# Patient Record
Sex: Male | Born: 1965 | Race: Black or African American | Hispanic: No | Marital: Married | Smoking: Never smoker
Health system: Southern US, Community
[De-identification: ages and names within clinical notes are randomized; demographics above are authoritative.]

## PROBLEM LIST (undated history)

## (undated) DIAGNOSIS — I4819 Other persistent atrial fibrillation: Secondary | ICD-10-CM

## (undated) DIAGNOSIS — K5790 Diverticulosis of intestine, part unspecified, without perforation or abscess without bleeding: Secondary | ICD-10-CM

## (undated) DIAGNOSIS — I519 Heart disease, unspecified: Secondary | ICD-10-CM

## (undated) DIAGNOSIS — N189 Chronic kidney disease, unspecified: Secondary | ICD-10-CM

## (undated) DIAGNOSIS — I10 Essential (primary) hypertension: Secondary | ICD-10-CM

## (undated) DIAGNOSIS — I4892 Unspecified atrial flutter: Secondary | ICD-10-CM

## (undated) DIAGNOSIS — I420 Dilated cardiomyopathy: Secondary | ICD-10-CM

## (undated) DIAGNOSIS — N200 Calculus of kidney: Secondary | ICD-10-CM

## (undated) DIAGNOSIS — D126 Benign neoplasm of colon, unspecified: Secondary | ICD-10-CM

## (undated) DIAGNOSIS — E785 Hyperlipidemia, unspecified: Secondary | ICD-10-CM

## (undated) HISTORY — DX: Diverticulosis of intestine, part unspecified, without perforation or abscess without bleeding: K57.90

## (undated) HISTORY — DX: Heart disease, unspecified: I51.9

## (undated) HISTORY — DX: Hyperlipidemia, unspecified: E78.5

## (undated) HISTORY — PX: WISDOM TOOTH EXTRACTION: SHX21

## (undated) HISTORY — DX: Calculus of kidney: N20.0

## (undated) HISTORY — DX: Unspecified atrial flutter: I48.92

## (undated) HISTORY — DX: Benign neoplasm of colon, unspecified: D12.6

## (undated) HISTORY — DX: Essential (primary) hypertension: I10

## (undated) HISTORY — DX: Chronic kidney disease, unspecified: N18.9

## (undated) HISTORY — DX: Dilated cardiomyopathy: I42.0

## (undated) HISTORY — DX: Other persistent atrial fibrillation: I48.19

---

## 2001-08-19 ENCOUNTER — Inpatient Hospital Stay (HOSPITAL_COMMUNITY): Admission: EM | Admit: 2001-08-19 | Discharge: 2001-08-24 | Payer: Self-pay | Admitting: Emergency Medicine

## 2001-08-19 ENCOUNTER — Encounter: Payer: Self-pay | Admitting: Emergency Medicine

## 2001-08-20 ENCOUNTER — Encounter: Payer: Self-pay | Admitting: Internal Medicine

## 2001-08-22 ENCOUNTER — Encounter: Payer: Self-pay | Admitting: Cardiology

## 2001-08-23 ENCOUNTER — Encounter: Payer: Self-pay | Admitting: Internal Medicine

## 2001-09-07 ENCOUNTER — Encounter: Admission: RE | Admit: 2001-09-07 | Discharge: 2001-09-07 | Payer: Self-pay | Admitting: Internal Medicine

## 2002-05-29 ENCOUNTER — Encounter: Admission: RE | Admit: 2002-05-29 | Discharge: 2002-05-29 | Payer: Self-pay | Admitting: Internal Medicine

## 2002-06-02 ENCOUNTER — Encounter: Admission: RE | Admit: 2002-06-02 | Discharge: 2002-06-02 | Payer: Self-pay | Admitting: Internal Medicine

## 2002-08-18 ENCOUNTER — Encounter: Admission: RE | Admit: 2002-08-18 | Discharge: 2002-08-18 | Payer: Self-pay | Admitting: Internal Medicine

## 2003-03-14 ENCOUNTER — Encounter: Admission: RE | Admit: 2003-03-14 | Discharge: 2003-03-14 | Payer: Self-pay | Admitting: Internal Medicine

## 2003-10-05 ENCOUNTER — Ambulatory Visit: Payer: Self-pay | Admitting: Internal Medicine

## 2004-02-04 ENCOUNTER — Emergency Department (HOSPITAL_COMMUNITY): Admission: EM | Admit: 2004-02-04 | Discharge: 2004-02-04 | Payer: Self-pay | Admitting: Family Medicine

## 2004-02-08 ENCOUNTER — Ambulatory Visit: Payer: Self-pay | Admitting: Internal Medicine

## 2004-02-08 ENCOUNTER — Ambulatory Visit (HOSPITAL_COMMUNITY): Admission: RE | Admit: 2004-02-08 | Discharge: 2004-02-08 | Payer: Self-pay | Admitting: Internal Medicine

## 2004-11-13 ENCOUNTER — Ambulatory Visit: Payer: Self-pay | Admitting: Internal Medicine

## 2005-02-13 ENCOUNTER — Ambulatory Visit: Payer: Self-pay | Admitting: Internal Medicine

## 2006-01-04 DIAGNOSIS — E785 Hyperlipidemia, unspecified: Secondary | ICD-10-CM | POA: Insufficient documentation

## 2006-01-05 DIAGNOSIS — I10 Essential (primary) hypertension: Secondary | ICD-10-CM | POA: Insufficient documentation

## 2006-02-24 ENCOUNTER — Telehealth: Payer: Self-pay | Admitting: *Deleted

## 2006-03-02 ENCOUNTER — Ambulatory Visit: Payer: Self-pay | Admitting: *Deleted

## 2006-03-02 LAB — CONVERTED CEMR LAB
Cholesterol: 208 mg/dL — ABNORMAL HIGH (ref 0–200)
HDL: 41 mg/dL (ref >39)
LDL Cholesterol: 143 mg/dL — ABNORMAL HIGH (ref 0–99)
Total CHOL/HDL Ratio: 5.1
Triglycerides: 118 mg/dL (ref <150)
VLDL: 24 mg/dL (ref 0–40)

## 2006-03-16 ENCOUNTER — Encounter (INDEPENDENT_AMBULATORY_CARE_PROVIDER_SITE_OTHER): Payer: Self-pay | Admitting: *Deleted

## 2006-03-16 ENCOUNTER — Ambulatory Visit: Payer: Self-pay | Admitting: Internal Medicine

## 2006-03-16 LAB — CONVERTED CEMR LAB
BUN: 17 mg/dL (ref 6–23)
CO2: 29 meq/L (ref 19–32)
Calcium: 9.2 mg/dL (ref 8.4–10.5)
Chloride: 106 meq/L (ref 96–112)
Creatinine, Ser: 1.19 mg/dL (ref 0.40–1.50)
Glucose, Bld: 108 mg/dL — ABNORMAL HIGH (ref 70–99)
Potassium: 4.5 meq/L (ref 3.5–5.3)
Sodium: 139 meq/L (ref 135–145)

## 2006-04-13 ENCOUNTER — Ambulatory Visit: Payer: Self-pay | Admitting: *Deleted

## 2006-04-13 LAB — CONVERTED CEMR LAB
ALT: 16 units/L (ref 0–53)
AST: 14 units/L (ref 0–37)
Albumin: 4.4 g/dL (ref 3.5–5.2)
Alkaline Phosphatase: 73 units/L (ref 39–117)
BUN: 20 mg/dL (ref 6–23)
CO2: 25 meq/L (ref 19–32)
Calcium: 9.4 mg/dL (ref 8.4–10.5)
Chloride: 104 meq/L (ref 96–112)
Creatinine, Ser: 1.3 mg/dL (ref 0.40–1.50)
Glucose, Bld: 106 mg/dL — ABNORMAL HIGH (ref 70–99)
Potassium: 4.8 meq/L (ref 3.5–5.3)
Sodium: 139 meq/L (ref 135–145)
Total Bilirubin: 0.9 mg/dL (ref 0.3–1.2)
Total Protein: 7.2 g/dL (ref 6.0–8.3)

## 2006-05-25 ENCOUNTER — Ambulatory Visit (HOSPITAL_COMMUNITY): Admission: RE | Admit: 2006-05-25 | Discharge: 2006-05-25 | Payer: Self-pay | Admitting: *Deleted

## 2006-05-25 ENCOUNTER — Encounter (INDEPENDENT_AMBULATORY_CARE_PROVIDER_SITE_OTHER): Payer: Self-pay | Admitting: Cardiology

## 2006-05-26 ENCOUNTER — Telehealth (INDEPENDENT_AMBULATORY_CARE_PROVIDER_SITE_OTHER): Payer: Self-pay | Admitting: *Deleted

## 2006-07-19 ENCOUNTER — Telehealth: Payer: Self-pay | Admitting: *Deleted

## 2006-09-10 ENCOUNTER — Telehealth: Payer: Self-pay | Admitting: Internal Medicine

## 2007-02-07 ENCOUNTER — Telehealth: Payer: Self-pay | Admitting: *Deleted

## 2007-02-24 ENCOUNTER — Telehealth: Payer: Self-pay | Admitting: *Deleted

## 2007-03-08 ENCOUNTER — Ambulatory Visit: Payer: Self-pay | Admitting: Internal Medicine

## 2007-03-08 ENCOUNTER — Encounter (INDEPENDENT_AMBULATORY_CARE_PROVIDER_SITE_OTHER): Payer: Self-pay | Admitting: *Deleted

## 2007-03-08 LAB — CONVERTED CEMR LAB
BUN: 24 mg/dL — ABNORMAL HIGH (ref 6–23)
CO2: 24 meq/L (ref 19–32)
Calcium: 9.7 mg/dL (ref 8.4–10.5)
Chloride: 106 meq/L (ref 96–112)
Creatinine, Ser: 1.22 mg/dL (ref 0.40–1.50)
Glucose, Bld: 93 mg/dL (ref 70–99)
Potassium: 4.7 meq/L (ref 3.5–5.3)
Sodium: 141 meq/L (ref 135–145)

## 2007-03-15 ENCOUNTER — Telehealth: Payer: Self-pay | Admitting: *Deleted

## 2007-04-26 ENCOUNTER — Encounter (INDEPENDENT_AMBULATORY_CARE_PROVIDER_SITE_OTHER): Payer: Self-pay | Admitting: Pharmacy Technician

## 2007-10-04 ENCOUNTER — Telehealth: Payer: Self-pay | Admitting: *Deleted

## 2007-11-15 ENCOUNTER — Telehealth: Payer: Self-pay | Admitting: *Deleted

## 2007-12-05 ENCOUNTER — Encounter (INDEPENDENT_AMBULATORY_CARE_PROVIDER_SITE_OTHER): Payer: Self-pay | Admitting: *Deleted

## 2007-12-13 ENCOUNTER — Ambulatory Visit: Payer: Self-pay | Admitting: *Deleted

## 2007-12-13 LAB — CONVERTED CEMR LAB
ALT: 21 units/L (ref 0–53)
AST: 19 units/L (ref 0–37)
Albumin: 4.3 g/dL (ref 3.5–5.2)
Alkaline Phosphatase: 67 units/L (ref 39–117)
BUN: 18 mg/dL (ref 6–23)
CO2: 22 meq/L (ref 19–32)
Calcium: 8.8 mg/dL (ref 8.4–10.5)
Chloride: 103 meq/L (ref 96–112)
Cholesterol: 191 mg/dL (ref 0–200)
Creatinine, Ser: 1.19 mg/dL (ref 0.40–1.50)
Glucose, Bld: 95 mg/dL (ref 70–99)
HDL: 36 mg/dL — ABNORMAL LOW (ref >39)
LDL Cholesterol: 134 mg/dL — ABNORMAL HIGH (ref 0–99)
Potassium: 4.4 meq/L (ref 3.5–5.3)
Sodium: 139 meq/L (ref 135–145)
Total Bilirubin: 0.7 mg/dL (ref 0.3–1.2)
Total CHOL/HDL Ratio: 5.3
Total Protein: 6.8 g/dL (ref 6.0–8.3)
Triglycerides: 106 mg/dL (ref <150)
VLDL: 21 mg/dL (ref 0–40)

## 2008-05-08 ENCOUNTER — Telehealth: Payer: Self-pay | Admitting: *Deleted

## 2008-06-13 ENCOUNTER — Ambulatory Visit: Payer: Self-pay | Admitting: *Deleted

## 2008-06-13 LAB — CONVERTED CEMR LAB
ALT: 19 units/L (ref 0–53)
AST: 19 units/L (ref 0–37)
Albumin: 4.4 g/dL (ref 3.5–5.2)
Alkaline Phosphatase: 74 units/L (ref 39–117)
BUN: 24 mg/dL — ABNORMAL HIGH (ref 6–23)
CO2: 24 meq/L (ref 19–32)
Calcium: 9.3 mg/dL (ref 8.4–10.5)
Chloride: 104 meq/L (ref 96–112)
Cholesterol: 181 mg/dL (ref 0–200)
Creatinine, Ser: 1.23 mg/dL (ref 0.40–1.50)
GFR calc Af Amer: >60 mL/min (ref >60)
GFR calc non Af Amer: >60 mL/min (ref >60)
Glucose, Bld: 95 mg/dL (ref 70–99)
HDL: 36 mg/dL — ABNORMAL LOW (ref >39)
LDL Cholesterol: 126 mg/dL — ABNORMAL HIGH (ref 0–99)
Potassium: 4.8 meq/L (ref 3.5–5.3)
Sodium: 138 meq/L (ref 135–145)
Total Bilirubin: 0.8 mg/dL (ref 0.3–1.2)
Total CHOL/HDL Ratio: 5
Total Protein: 7.3 g/dL (ref 6.0–8.3)
Triglycerides: 94 mg/dL (ref <150)
VLDL: 19 mg/dL (ref 0–40)

## 2008-06-14 ENCOUNTER — Encounter (INDEPENDENT_AMBULATORY_CARE_PROVIDER_SITE_OTHER): Payer: Self-pay | Admitting: *Deleted

## 2008-12-07 ENCOUNTER — Telehealth: Payer: Self-pay | Admitting: Internal Medicine

## 2008-12-11 ENCOUNTER — Telehealth: Payer: Self-pay | Admitting: Internal Medicine

## 2009-02-04 ENCOUNTER — Telehealth: Payer: Self-pay | Admitting: Internal Medicine

## 2009-07-02 ENCOUNTER — Ambulatory Visit: Payer: Self-pay | Admitting: Internal Medicine

## 2009-07-08 LAB — CONVERTED CEMR LAB
ALT: 21 units/L (ref 0–53)
CO2: 26 meq/L (ref 19–32)
Chloride: 104 meq/L (ref 96–112)
Cholesterol: 171 mg/dL (ref 0–200)
Sodium: 138 meq/L (ref 135–145)
Total Bilirubin: 0.9 mg/dL (ref 0.3–1.2)
Total Protein: 7.1 g/dL (ref 6.0–8.3)
VLDL: 21 mg/dL (ref 0–40)

## 2010-01-19 DIAGNOSIS — N200 Calculus of kidney: Secondary | ICD-10-CM

## 2010-01-19 HISTORY — DX: Calculus of kidney: N20.0

## 2010-02-18 NOTE — Progress Notes (Signed)
Summary: refill/ hla  Phone Note Refill Request Message from:  Fax from Pharmacy on February 04, 2009 5:34 PM  Refills Requested: Medication #1:  ENALAPRIL MALEATE 20 MG TABS Take 1 tablet by mouth twice a day   Last Refilled: 12/19 Initial call taken by: Marin Roberts RN,  February 04, 2009 5:34 PM    Prescriptions: ENALAPRIL MALEATE 20 MG TABS (ENALAPRIL MALEATE) Take 1 tablet by mouth twice a day  #60 x 5   Entered and Authorized by:   Zoila Shutter MD   Signed by:   Zoila Shutter MD on 02/06/2009   Method used:   Electronically to        [facility] [address] (retail)       [phone]       [place], [place]  [address]       Ph: [phone]       Fax: [phone]   RxID:   [id number]

## 2010-02-18 NOTE — Assessment & Plan Note (Signed)
Summary: or5   Vital Signs:  Patient profile:   45 year old male Height:      70 inches (177.80 cm) Weight:      238.1 pounds (108.23 kg) BMI:     34.29 Temp:     98.4 degrees F (36.89 degrees C) oral Pulse rate:   70 / minute BP sitting:   126 / 82  (left arm) Cuff size:   large  Vitals Entered By: Cynda Familia Duncan Dull) (July 02, 2009 9:06 AM) CC: routine f/u and med refill Is Patient Diabetic? No Pain Assessment Patient in pain? no      Nutritional Status BMI of 25 - 29 = overweight  Have you ever been in a relationship where you felt threatened, hurt or afraid?No   Does patient need assistance? Functional Status Self care Ambulation Normal   Primary Care Provider:  Manning Charity  CC:  routine f/u and med refill.  History of Present Illness: 45 years old patient pt. who comes in to establish primary care. Patient has lost 24 pounds and is eating well and taking great care of himself. 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 He is taking fish oil. He bought an elyptical and does 10-12 miles on it a week.  His BP at home, which he checks 1-2 times a week, is 120-139/80's. He is anxious when he comes in here and it is up. however even his first reading is much better today. He did self d/c his lasix as he was unable to run to the bathroom with his job.  He has 2 dauhters, 9 and 19, and wants to be there for them.   He did get a flu shot this year.   Preventive Screening-Counseling & Management  Alcohol-Tobacco     Smoking Status: never  Caffeine-Diet-Exercise     Does Patient Exercise: yes      Drug Use:  no.    Current Medications (verified): 1)  Enalapril Maleate 20 Mg Tabs (Enalapril Maleate) .... Take 1 Tablet By Mouth Twice A Day 2)  Coreg 25 Mg Tabs (Carvedilol) .... Take 1 Tablet By Mouth Two Times A Day 3)  Aspirin 81 Mg Tbec (Aspirin) .... Take 1 Tablet By Mouth Once A Day 4)  Multivitamins  Tabs (Multiple Vitamin) .... Take 1 Tablet By  Mouth Once A Day 5)  Pravachol 40 Mg Tabs (Pravastatin Sodium) .... Take 1 Tablet By Mouth Once A Day 6)  Norvasc 5 Mg Tabs (Amlodipine Besylate) .... Take 1 Tablet By Mouth Once A Day 7)  Fish Oil Maximum Strength 1200 Mg Caps (Omega-3 Fatty Acids) .... One Two Times A Day  Allergies (verified): No Known Drug Allergies  Past History:  Past Surgical History: dental surgery  Family History: Family History Hypertension Family History of Alcoholism/Addiction  Social History: Married.  Two daughters- 9yo and 19yo )(2011.   Alcohol use-no Drug use-no Regular exercise-yes Drug Use:  no  Review of Systems General:  Denies fatigue, sleep disorder, and weakness. CV:  Denies chest pain or discomfort and palpitations. Resp:  Denies cough and shortness of breath. GI:  Denies constipation, nausea, and vomiting. MS:  Denies joint pain.  Physical Exam  General:  alert and well-developed.  excited about taking care of himself, BP to repeat check-126/82 Head:  normocephalic and atraumatic.   Eyes:  vision grossly intact.   Mouth:  good dentition.   Neck:  supple.   Breasts:     Lungs:  normal respiratory effort and normal breath sounds.   Heart:  normal rate, regular rhythm, no murmur, and no gallop.   Abdomen:  soft, non-tender, and normal bowel sounds.   Msk:  normal ROM and no joint swelling.   Extremities:  no edema   Impression & Recommendations:  Problem # 1:  HYPERTENSION (ICD-401.9) Patient is doing an amazing job as far as change in diet and weight loss. His BP is well controlled today even though he self d/ced lasix, and with the addition of amlodipine. He is encouraged to continue these healthy changes. I have also asked him to let me know if he feels he needs to stop a medication in the future. He will follow up in 6 months. The following medications were removed from the medication list:    Lasix 40 Mg Tabs (Furosemide) .[name]... Take 1 tablet by mouth once a day His  updated medication list for this problem includes:    Enalapril Maleate 20 Mg Tabs (Enalapril maleate) .[name]... Take 1 tablet by mouth twice a day    Coreg 25 Mg Tabs (Carvedilol) .[name]... Take 1 tablet by mouth two times a day    Norvasc 5 Mg Tabs (Amlodipine besylate) .[name]... Take 1 tablet by mouth once a day  Orders: T-Comprehensive Metabolic Panel ([id number])  Problem # 2:  HYPERLIPIDEMIA (ICD-272.4)  His updated medication list for this problem includes:    Pravachol 40 Mg Tabs (Pravastatin sodium) .[name]... Take 1 tablet by mouth once a day  Orders: T-Comprehensive Metabolic Panel [id number]) T-Lipid Profile ([id number])  Problem # 3:  OVERWEIGHT (ICD-278.02) Again patient is doing a great job with this. His BMI has decreased from 37.7 to 34.  Problem # 4:  Preventive Health Care (ICD-V70.0) Flu shot received this year. U p to date on all appropriate health maintenance. Should get an eye check upsometime in the next year.  Complete Medication List: 1)  Enalapril Maleate 20 Mg Tabs (Enalapril maleate) .... Take 1 tablet by mouth twice a day 2)  Coreg 25 Mg Tabs (Carvedilol) .... Take 1 tablet by mouth two times a day 3)  Aspirin 81 Mg Tbec (Aspirin) .... Take 1 tablet by mouth once a day 4)  Multivitamins Tabs (Multiple vitamin) .... Take 1 tablet by mouth once a day 5)  Pravachol 40 Mg Tabs (Pravastatin sodium) .... Take 1 tablet by mouth once a day 6)  Norvasc 5 Mg Tabs (Amlodipine besylate) .... Take 1 tablet by mouth once a day 7)  Fish Oil Maximum Strength 1200 Mg Caps (Omega-3 fatty acids) .... One two times a day  Patient Instructions: 1)  Please schedule a follow-up appointment in 6 months. 2)  I will refill all of your medications for 3 months at a time for a year. 3)  We are getting fasting blood work today and will let you know when it comes back. 4)  Keep up the great work!!! YOU ARE DOING GREAT! Prescriptions: NORVASC 5 MG TABS (AMLODIPINE BESYLATE) Take 1 tablet by  mouth once a day  #90 x 3   Entered and Authorized by:   Zoila Shutter MD   Signed by:   Zoila Shutter MD on 07/02/2009   Method used:   Electronically to        [facility] [address] (retail)       [phone]       [place], [place]  [address]  Ph: [phone]       Fax: [phone]   RxID:   [id number] PRAVACHOL 40 MG TABS (PRAVASTATIN SODIUM) Take 1 tablet by mouth once a day  #90 x 3   Entered and Authorized by:   Zoila Shutter MD   Signed by:   Zoila Shutter MD on 07/02/2009   Method used:   Electronically to        [facility] [address] (retail)       [phone]       [place], [place]  [address]       Ph: [phone]       Fax: [phone]   RxID:   [id number] COREG 25 MG TABS (CARVEDILOL) Take 1 tablet by mouth two times a day  #180 x 3   Entered and Authorized by:   Zoila Shutter MD   Signed by:   Zoila Shutter MD on 07/02/2009   Method used:   Electronically to        [facility] [address] (retail)       [phone]       [place], [place]  [address]       Ph: [phone]       Fax: [phone]   RxID:   [id number] ENALAPRIL MALEATE 20 MG TABS (ENALAPRIL MALEATE) Take 1 tablet by mouth twice a day  #180 x 3   Entered and Authorized by:   Zoila Shutter MD   Signed by:   Zoila Shutter MD on 07/02/2009   Method used:   Electronically to        [facility] [address] (retail)       [phone]       [place], [place]  [address]       Ph: [phone]       Fax: [phone]   RxID:   [id number]  Process Orders Check Orders Results:     [facility]: ABN not required for this insurance Tests Sent for requisitioning (July 02, 2009 10:37 AM):     07/02/2009: [facility] -- T-Comprehensive Metabolic Panel [[id number]] (signed)     07/02/2009:  [facility] -- T-Lipid Profile [id number] (signed)    Prevention & Chronic Care Immunizations   Influenza vaccine: Not documented    Tetanus booster: Not documented    Pneumococcal vaccine: Not documented  Other Screening   PSA: Not documented   Smoking status: never  (07/02/2009)  Lipids   Total Cholesterol: 181  (06/13/2008)   LDL: 126  (06/13/2008)   LDL Direct: Not documented   HDL: 36  (06/13/2008)   Triglycerides: 94  (06/13/2008)    SGOT (AST): 19  (06/13/2008)   SGPT (ALT): 19  (06/13/2008) CMP ordered    Alkaline phosphatase: 74  (06/13/2008)   Total bilirubin: 0.8  (06/13/2008)    Lipid flowsheet reviewed?: Yes   Progress toward LDL goal: Improved  Hypertension   Last Blood Pressure: 126 / 82  (07/02/2009)   Serum creatinine: 1.23  (06/13/2008)   Serum potassium 4.8  (06/13/2008) CMP ordered     Hypertension flowsheet reviewed?: Yes   Progress toward BP goal: At goal  Self-Management Support :    Patient will work on the following items until the next clinic visit to reach self-care goals:  Medications and monitoring: take my medicines every day  (07/02/2009)     Eating: eat foods that are low in salt, eat baked foods instead of fried foods  (07/02/2009)     Activity: take a 30 minute walk every day  (07/02/2009)    Hypertension self-management support: Pre-printed educational material, Resources for patients handout, Written self-care plan  (07/02/2009)   Hypertension self-care plan printed.    Lipid self-management support: Pre-printed educational material, Resources for patients handout, Written self-care plan  (07/02/2009)   Lipid self-care plan printed.      Resource handout printed.

## 2010-06-06 NOTE — Discharge Summary (Signed)
NAME:  Robert White, Robert White                           ACCOUNT NO.:  [id number]   MEDICAL RECORD NO.:  [id number]                   PATIENT TYPE:  INP   LOCATION:  4703                                 FACILITY:  [facility]   PHYSICIAN:  Alvester Morin, M.D.               DATE OF BIRTH:  1965-11-20   DATE OF ADMISSION:  08/19/2001  DATE OF DISCHARGE:  08/24/2001                                 DISCHARGE SUMMARY   DISCHARGE DIAGNOSES:  1. Pneumonia.  2. 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  3. Uncontrolled hypertension secondary to cocaine use.  4. History of renal failure probably secondary to cocaine use.  5. Polysubstance abuse, cocaine and alcohol abuse.   DISCHARGE MEDICATIONS:  1. Tequin 400 mg one p.o. for five days.  2. Enalapril 2.5 mg b.i.d.  3. Lasix 20 mg b.i.d.  4. Digoxin 0.125 mg one q.d.  5. Coreg 6.25 mg one b.i.d.  6. Aspirin 325 mg one q.d.   DISPOSITION:  The patient was sent home on August 24, 2001, and was to follow  up with AA and NA regarding his polysubstance abuse and with Dr. Liliane Channel in  the [facility].   PROCEDURES:  Procedures performed in the hospital were as follows:  1. An echocardiogram which showed the following:  Left ventricle mildly to     moderately dilated.  Left overall ventricular systolic function mildly     decreased.  The left ventricular ejection fraction was 25-30%.  There was     diffuse left ventricular hypokinesis.  Mild mitral valve replacement.     The left atrium was mildly dilated.  2. Cardiac catheterization which showed the following:  Normal right heart     hemodynamics with low cardiac output __________ noted.  Globally     hypokinetic left ventricle of a dilated type compatible with idiopathic     dilated cardiomyopathy.  Angiographically patent coronary arteries and     anatomical circumflex dominant system.  Normal renal arteries.  Normal     abdominal aorta.   HISTORY OF  PRESENT ILLNESS:  The patient is a 45 year old African American  male who came in with complaints of cough.  No significant past medical  history.  His cough was significant for productive greenish-colored sputum.  He had occasional chills and sweats, but no reported fevers.  He was seen at  urgent care that day and had an increased blood pressure of 170/130.  He was  told to come to the [facility].  When he presented, he had increased blood pressure and an abnormal EKG.  He  did not have any chest pressure or pleuritic pain nor any reflux symptoms.  No history of any orthopnea.  He has a history of sick contacts around him.  No history of any shortness of breath.   PAST  MEDICAL HISTORY:  Not significant.   MEDICATIONS:  He has been on no medications prior to arrival.  The only over  the count products he has used are Alka-Seltzer, NyQuil, and Sudafed.   ALLERGIES:  No known drug allergies.   FAMILY HISTORY:  His father had heart disease at 45 years of age.  Both his  mother and father were hypertensive.  A grandmother with diabetes.   HABITS:  He is not a smoker.  Alcohol:  He drinks a six-pack a day plus two  shots for the past several years.  Cocaine:  He continues to use.   SOCIAL HISTORY:  He works as an Chief Financial Officer ___________  He lives with his wife  and two children.  He gets insurance through his work.   PHYSICAL EXAMINATION:  VITAL SIGNS:  Pulse 99, respiratory rate 28, blood  pressure 170/130, temperature 98.8 degrees.  HEENT:  PERRLA.  Extraocular movements intact.  NECK:  No JVD or bruits.  LUNGS:  Clear to auscultation bilaterally.  No rales, rhonchi, or wheezes.  CARDIOVASCULAR:  He had a _________ precordium.  The PMI was displaced  downwards and upwards.  No murmur, rub, or gallop.  ABDOMEN:  Soft, nondistended, and nontender.  Bowel sounds were heard.  EXTREMITIES:  He had no cyanosis, clubbing, or edema.  Peripheral pulses   were 2+.  NEUROLOGIC:  Intact.   LABORATORY DATA ON ADMISSION:  His labs on arrival were sodium 138,  potassium 4.4, chloride 107, bicarbonate 27, BUN 19, creatinine 1.6, and  glucose 100.  His white count was 12.4, hemoglobin 16.0, and platelets 213.  His ANC was 10.3 with 84% BMNs.  The PT was 13.1 and PTT 29.  His BNP was  390.  The CK-MBs, which were cycled three times, were as follows:  CKs 318,  334, and 292, MBs 3.6, 2.4, and 1.9, and troponin I's 0.04, 0.06, and 0.04.   His chest x-ray showed cardiomegaly with right middle and upper lobe  airspace disease versus edema.  The left perihilar airspace had increased  markings and no effusions.  The EKG showed sinus tachycardia, normal axis,  left ventricular hypertrophy, biatrial enlargement, and the ST segment was  reversed in leads 2, V4, and V6.   HOSPITAL COURSE:  1. PNEUMONIA:  His pneumonia was treated with antibiotics.  We started him     off with ceftriaxone 1 g IV and azithromycin for treatment of his     pneumonia.   1. UNCONTROLLED HYPERTENSION:  Probably secondary to cocaine abuse.  The     patient's urine drug screen showed positive for cocaine.  He was started     on clonidine 0.1 mg p.o. b.i.d., hydrochlorothiazide 25 mg p.o., and     Lasix 40 mg IV for his hypertension.   1. CARDIOMEGALY:  Probably secondary to cocaine and alcohol abuse.  A 2-D     echocardiogram was performed which showed left ventricle mildly to     moderately dilated, left ventricular systolic function decreased,     ejection fraction 25-30%, and severe diffuse left ventricular     hypokinesis.  There was mild mitral valve regurgitation and the left     atrium was mildly dilated.  A cardiology consult was asked for.  He got a     catheterization which showed normal right heart hemodynamics with low     cardiac output and also globally hypokinetic left ventricle of a dilated    type compatible with idiopathic  dilated cardiomyopathy.   Angiographically     he had patent coronary arteries with an anatomical circumflex dominant     system.  He was discharged with enalapril 2.5 mg b.i.d., Lasix 20 mg     b.i.d., digoxin 0.125 mg one q.d., and Coreg 6.25 mg b.i.d., as well as     aspirin 325 mg one p.o. q.d.   1. ACUTE RENAL FAILURE:  Probably secondary to cocaine abuse.  His     creatinine was increased.  We rehydrated him and continued to follow his     creatinine.  On discharge, it was 1.5.   1. POLYSUBSTANCE ABUSE:  The patient has been abusing alcohol, as well as     cocaine, for several years ago.  We encouraged him to stop drinking and     he seems very motivated to stop drinking, as well as using cocaine, as     this has really scared him.  He was given the numbers of AA and NA on     discharge for him to follow up with them.  He is also going to see Dr.     Liliane Channel in the clinic.  She will follow up on his polysubstance abuse.     Several times I have spoken to the patient, telling how important it is     for him to quit as both are dangerous to his life.  He seems motivated     and will follow that up.   LABORATORY DATA ON DISCHARGE:  The sodium is 138, potassium 4.0, chloride  101, carbon dioxide 29, BUN 19, and creatinine 1.5.  His white count was  10.2, hemoglobin 15.3, and platelets 289.  His PT was 13.7 and INR 1.0.     Liliane Channel, M.D.                              Alvester Morin, M.D.    Hadley Pen  D:  09/10/2001  T:  09/13/2001  Job:  [id number]   cc:   Outpatient Clinic   Madaline Savage, M.D.

## 2010-06-06 NOTE — Discharge Summary (Signed)
   NAME:  Robert White, Robert White                           ACCOUNT NO.:  [id number]   MEDICAL RECORD NO.:  [id number]                   PATIENT TYPE:  INP   LOCATION:  4703                                 FACILITY:  [facility]   PHYSICIAN:  Alvester Morin, M.D.               DATE OF BIRTH:  1965/03/02   DATE OF ADMISSION:  08/19/2001  DATE OF DISCHARGE:  08/24/2001                                 DISCHARGE SUMMARY   DISCHARGE DIAGNOSES:  1. Pneumonia.  2. Cardiomyopathy secondary to cocaine.   DICTATION STOPS HERE.     No First Name Liliane Channel, M.D.                Alvester Morin, M.D.    NS/MEDQ  D:  09/10/2001  T:  09/13/2001  Job:  [id number]

## 2010-06-06 NOTE — Cardiovascular Report (Signed)
NAME:  Robert White, Robert White                           ACCOUNT NO.:  [id number]   MEDICAL RECORD NO.:  [id number]                   PATIENT TYPE:  INP   LOCATION:  4703                                 FACILITY:  [facility]   PHYSICIAN:  Madaline Savage, M.D.             DATE OF BIRTH:  04-25-1965   DATE OF PROCEDURE:  08/23/2001  DATE OF DISCHARGE:                              CARDIAC CATHETERIZATION   DATE OF PROCEDURE:  August 23, 2001   PROCEDURES PERFORMED:  1. Selective coronary angiography by Judkins technique.  2. Retrograde left heart catheterization.  3. Left ventricular angiography.  4. Right heart catheterization.  5. Thermodilution cardiac output determinations.  6. Abdominal aortography.   COMPLICATIONS:  None.   ENTRY SITE:  Right femoral.   DYE USED:  Omnipaque.   PATIENT PROFILE:  The patient is a 45 year old African-American married male  without a known previous medical history of any major significant illnesses.  He was admitted August 19, 2001, with a 3-week history of productive cough  and greenish sputum, and was seen at an urgent care center where his blood  pressure was elevated and an abnormal EKG ended up bringing him to the [facility] Emergency Room for evaluation.  He was thereafter admitted.  The  patient admitted to use of cocaine and his urine test was positive for  cocaine.  An echocardiogram was performed when the patient's troponin level  was high and his CK levels were high with normal CK-MBs.  He also had a  slightly elevated B-natriuretic peptide.   Today, the patient presents to the Cardiac Cath Lab in stable condition for  elective cardiac catheterization in view of an echocardiogram showing  diffuse hypokinesis of the left ventricle with an ejection fraction  estimated at 20-25%.   PRESSURE RESULTS:  The left ventricular pressure was 108/4 with a mean of  10.  Central aortic pressure was 108/80 with a mean of 95.  No aortic valve  gradient by  pullback technique.  Right atrial mean pressure was 6 and right  ventricular pressure was 25/4 with an end diastolic pressure of 6.  Pulmonary artery pressure was 25/13 with a mean of 18.  Pulmonary capillary  wedge mean pressure was 12.  The cardiac output by thermal method was 5.8  liters per minute and cardiac index was 2.6 liters per minute per meter  square.  The thick cardiac output was 4.4 and the cardiac index by thick  method was 2.0.   ANGIOGRAPHIC RESULTS:  The left ventricle showed global hypokinesis of all  wall segments most severe at the apex.  No obvious left ventricular  hypertrophy noted.  No LV thrombus.  No mitral regurgitation seen.  Ejection  fraction estimate was 20-25% ejection fraction.   Angiographically the coronary arteries were entirely normal.  The left main  was normal.  The LAD and diagonal branches were normal.  The circumflex was  large and dominant, giving rise to two obtuse marginal branches and a large  posterior descending branch.   The right coronary artery was small and nondominant with no lesions seen.  Abdominal aortography showed a normal abdominal aorta and normal renal  arteries.   FINAL DIAGNOSES:  1. Normal right heart hemodynamics with low cardiac output by thick method.  2. Globally hypokinetic left ventricle of a dilated type compatible with     idiopathic dilated cardiomyopathy.  3. Angiographically patent coronary arteries with an anatomically circumflex-     dominant system.  4. Normal renal arteries.  5. Normal abdominal aorta.   RECOMMENDATIONS:  The patient should be treated with Lanoxin, ACE inhibitor,  beta blockers, diuretics on a p.r.n. basis, and should undergo phase 2  cardiac rehab and should be counseled as to the necessity to avoid  recreational drugs, alcohol, and tobacco.  He should have follow up on a  regular basis.                                                Madaline Savage, M.D.    WHG/MEDQ  D:   08/23/2001  T:  08/28/2001  Job:  [id number]

## 2010-07-06 ENCOUNTER — Encounter: Payer: Self-pay | Admitting: Internal Medicine

## 2010-07-07 ENCOUNTER — Encounter: Payer: Self-pay | Admitting: Internal Medicine

## 2010-07-08 ENCOUNTER — Encounter: Payer: Self-pay | Admitting: Internal Medicine

## 2010-07-09 ENCOUNTER — Encounter: Payer: Self-pay | Admitting: Internal Medicine

## 2010-07-09 ENCOUNTER — Other Ambulatory Visit: Payer: Self-pay | Admitting: Internal Medicine

## 2010-07-09 ENCOUNTER — Ambulatory Visit (INDEPENDENT_AMBULATORY_CARE_PROVIDER_SITE_OTHER): Payer: 59 | Admitting: Internal Medicine

## 2010-07-09 VITALS — BP 132/78 | HR 75 | Temp 97.7°F | Ht 70.0 in | Wt 233.0 lb

## 2010-07-09 DIAGNOSIS — R0789 Other chest pain: Secondary | ICD-10-CM

## 2010-07-09 DIAGNOSIS — E663 Overweight: Secondary | ICD-10-CM

## 2010-07-09 DIAGNOSIS — E785 Hyperlipidemia, unspecified: Secondary | ICD-10-CM

## 2010-07-09 DIAGNOSIS — R071 Chest pain on breathing: Secondary | ICD-10-CM

## 2010-07-09 DIAGNOSIS — I10 Essential (primary) hypertension: Secondary | ICD-10-CM

## 2010-07-09 LAB — COMPREHENSIVE METABOLIC PANEL
ALT: 17 U/L (ref 0–53)
AST: 18 U/L (ref 0–37)
Alkaline Phosphatase: 78 U/L (ref 39–117)
CO2: 26 mEq/L (ref 19–32)
Sodium: 141 mEq/L (ref 135–145)
Total Bilirubin: 0.8 mg/dL (ref 0.3–1.2)
Total Protein: 7.1 g/dL (ref 6.0–8.3)

## 2010-07-09 LAB — LIPID PANEL
Cholesterol: 190 mg/dL (ref 0–200)
LDL Cholesterol: 131 mg/dL — ABNORMAL HIGH (ref 0–99)
VLDL: 19 mg/dL (ref 0–40)

## 2010-07-09 MED ORDER — ENALAPRIL MALEATE 20 MG PO TABS: 20.0000 mg | ORAL_TABLET | Freq: Two times a day (BID) | ORAL | Status: DC

## 2010-07-09 MED ORDER — PRAVASTATIN SODIUM 40 MG PO TABS: 40.0000 mg | ORAL_TABLET | Freq: Every day | ORAL | Status: DC

## 2010-07-09 MED ORDER — HYDROCODONE-ACETAMINOPHEN 5-325 MG PO TABS: ORAL_TABLET | ORAL | Status: DC

## 2010-07-09 MED ORDER — CARVEDILOL 25 MG PO TABS: 25.0000 mg | ORAL_TABLET | Freq: Two times a day (BID) | ORAL | Status: DC

## 2010-07-09 NOTE — Progress Notes (Signed)
  Subjective:    Patient ID: Robert White, male    DOB: 1965/11/18, 46 y.o.   MRN: [id number]  HPI 45 year old patient who comes in for followup of hypertension hyperlipidemia. Patient is doing well and continues to lose weight. He has lost 5 more pounds since he was a year ago. He continues to exercise daily. He also continues to eat well and take excellent care of himself. He does not smoke.  Patient's only complaint is that of a little bit of some dull pain like a toothache on his right costal margin. He did have a little bit of a URI that preceded this and he was coughing a bit. As well he used an elliptical machine that he was not familiar with when he was traveling. Patient says that it does feel worse with deep breath or if he turns his upper body from one side to the other. It is not associated with chest pain, shortness of breath, nausea, diaphoresis, eating, or any other GI or GU symptoms.    Review of Systems  Constitutional: Negative for fatigue.  Respiratory: Negative for shortness of breath.   Cardiovascular: Negative for chest pain.  Gastrointestinal: Negative for nausea, vomiting and abdominal pain.       Objective:   Physical Exam  Constitutional: He appears well-developed and well-nourished.  HENT:  Head: Normocephalic and atraumatic.  Neck: Normal range of motion. No JVD present.  Cardiovascular: Normal rate and regular rhythm.   No murmur heard. Pulmonary/Chest: Breath sounds normal.  Abdominal: Bowel sounds are normal. There is no tenderness.  Musculoskeletal: He exhibits no edema.   there is no tenderness along the right costal margin, however there are muscles just below the costal margin the patient tenses up are tender to palpation.        Assessment & Plan:

## 2010-07-09 NOTE — Patient Instructions (Signed)
I will see you back in 6 months. Keep up the good work. The vicodin can be taken 1-2 pills for the pain in your side. You will probably want to take it mainly at night as it may make you drowsy.

## 2010-07-09 NOTE — Assessment & Plan Note (Signed)
Patient is fasting today we'll check a lipid panel and Cmet.

## 2010-07-09 NOTE — Assessment & Plan Note (Signed)
Blood pressure with excellent control in the 3 agents that he is on. I will refill these for a year 90 days at a time.

## 2010-07-09 NOTE — Assessment & Plan Note (Addendum)
Robert White continues to exercise regularly and lose weight steadily each year when he comes in.

## 2010-07-09 NOTE — Assessment & Plan Note (Signed)
I reassured patient that the pain in his right anterior flank area I am certain is musculoskeletal. It was probably either from coughing from URI her using an elliptical that was different for him he said that he held his body differently. Patient says the nostrils were not tackling the pain therefore have given him 30 Vicodin. He will probably just take one to 2 at night as as he does not want to take them during the day. As well have suggested that he continue his regular exercise but not do anything that seems to exacerbate this discomfort. I told her it may take a couple weeks to start getting better as it does seem like an internal muscle strain. As well suggested that he drinks a lot of water he already does and will continue doing so.

## 2010-07-11 ENCOUNTER — Other Ambulatory Visit: Payer: Self-pay | Admitting: Internal Medicine

## 2010-07-11 DIAGNOSIS — E785 Hyperlipidemia, unspecified: Secondary | ICD-10-CM

## 2010-07-11 MED ORDER — ATORVASTATIN CALCIUM 20 MG PO TABS: 20.0000 mg | ORAL_TABLET | Freq: Every day | ORAL | Status: DC

## 2010-09-23 ENCOUNTER — Ambulatory Visit (INDEPENDENT_AMBULATORY_CARE_PROVIDER_SITE_OTHER): Payer: 59 | Admitting: Internal Medicine

## 2010-09-23 DIAGNOSIS — E785 Hyperlipidemia, unspecified: Secondary | ICD-10-CM

## 2010-09-23 LAB — COMPREHENSIVE METABOLIC PANEL
ALT: 14 U/L (ref 0–53)
Albumin: 4.2 g/dL (ref 3.5–5.2)
CO2: 26 mEq/L (ref 19–32)
Chloride: 105 mEq/L (ref 96–112)
Potassium: 4.2 mEq/L (ref 3.5–5.3)
Sodium: 140 mEq/L (ref 135–145)
Total Bilirubin: 1.1 mg/dL (ref 0.3–1.2)
Total Protein: 6.8 g/dL (ref 6.0–8.3)

## 2010-09-23 LAB — LIPID PANEL
HDL: 44 mg/dL (ref >39)
LDL Cholesterol: 94 mg/dL (ref 0–99)

## 2010-09-24 NOTE — Progress Notes (Signed)
Patient here for labs.

## 2011-06-29 ENCOUNTER — Other Ambulatory Visit: Payer: Self-pay | Admitting: Internal Medicine

## 2011-06-29 DIAGNOSIS — I10 Essential (primary) hypertension: Secondary | ICD-10-CM

## 2011-07-09 ENCOUNTER — Other Ambulatory Visit: Payer: Self-pay | Admitting: Internal Medicine

## 2011-07-29 ENCOUNTER — Encounter: Payer: Self-pay | Admitting: Internal Medicine

## 2011-07-29 ENCOUNTER — Ambulatory Visit (INDEPENDENT_AMBULATORY_CARE_PROVIDER_SITE_OTHER): Payer: 59 | Admitting: Internal Medicine

## 2011-07-29 VITALS — BP 150/89 | HR 69 | Temp 98.5°F | Ht 70.0 in | Wt 249.6 lb

## 2011-07-29 DIAGNOSIS — N183 Chronic kidney disease, stage 3 unspecified: Secondary | ICD-10-CM | POA: Insufficient documentation

## 2011-07-29 DIAGNOSIS — I10 Essential (primary) hypertension: Secondary | ICD-10-CM

## 2011-07-29 DIAGNOSIS — I129 Hypertensive chronic kidney disease with stage 1 through stage 4 chronic kidney disease, or unspecified chronic kidney disease: Secondary | ICD-10-CM

## 2011-07-29 DIAGNOSIS — N189 Chronic kidney disease, unspecified: Secondary | ICD-10-CM

## 2011-07-29 DIAGNOSIS — N200 Calculus of kidney: Secondary | ICD-10-CM

## 2011-07-29 DIAGNOSIS — E785 Hyperlipidemia, unspecified: Secondary | ICD-10-CM

## 2011-07-29 LAB — CBC
HCT: 45.9 % (ref 39.0–52.0)
Platelets: 274 10*3/uL (ref 150–400)
RDW: 13.7 % (ref 11.5–15.5)
WBC: 7.9 10*3/uL (ref 4.0–10.5)

## 2011-07-29 MED ORDER — AMLODIPINE BESYLATE 10 MG PO TABS: 10.0000 mg | ORAL_TABLET | Freq: Every day | ORAL | Status: DC

## 2011-07-29 NOTE — Assessment & Plan Note (Signed)
The patient has a history of HTN, which is borderline elevated today -increase amlodipine from 5 mg to 10 mg daily -continue carvedilol, enalapril

## 2011-07-29 NOTE — Patient Instructions (Addendum)
We are increasing your Amlodipine from 5 mg per day to 10 mg per day (though it will still only be 1 tablet per day, of the higher-dose tablet).  We are also checking lab work today.  We will call you if the results are abnormal, otherwise you can assume that the results were normal.  Please return in 58-months for a follow-up visit.  To decrease your risk for future kidney stones, make the following dietary changes: 1. Drink more fluids 2. Decreasing the amount of animal protein in your diet 3. Increasing fruit and vegetable intake 4. Limiting foods high in oxalate, such as spinach, rhubarb, teas, peanuts 5. Limiting salt intake 6. Increasing dietary calcium (milk, yogurt)

## 2011-07-29 NOTE — Assessment & Plan Note (Signed)
Baseline Cr = 1.2, etiology unknown.  Likely secondary to chronic HTN -check urine protein/creatinine ratio today

## 2011-07-29 NOTE — Assessment & Plan Note (Signed)
Well-controlled when checked <1 year ago -continue lipitor

## 2011-07-29 NOTE — Progress Notes (Signed)
HPI The patient is a 46 y.o. yo male with a history of HTN, HL, presenting for a follow-up visit.  The patient was last seen 1 year ago.  Since then, he notes that his father has passed away, which took a significant emotional toll.  He fell out of his exercise/diet routine, and has gained back some of the weight he had lost.  However, now, he has restarted weight training and walking around the track.  He also notes several episodes of nephrolithiasis over the last year, managed by a local urologist, with his most recent stone passage occurring 2 months ago.  The patient's BP is elevated today, though the patient's home BP readings (which he brought to clinic today) show mostly values in the SBP 130's.  The patient has been compliant with his medications.  ROS: General: no fevers, chills, changes in weight, changes in appetite Skin: no rash HEENT: no blurry vision, hearing changes, sore throat Pulm: no dyspnea, coughing, wheezing CV: no chest pain, palpitations, shortness of breath Abd: no abdominal pain, nausea/vomiting, diarrhea/constipation GU: no dysuria, hematuria, polyuria Ext: no arthralgias, myalgias Neuro: no weakness, numbness, or tingling  Filed Vitals:   07/29/11 1536  BP: 150/89  Pulse: 69  Temp: 98.5 F (36.9 C)    PEX General: alert, cooperative, and in no apparent distress HEENT: pupils equal round and reactive to light, vision grossly intact, oropharynx clear and non-erythematous  Neck: supple, no lymphadenopathy Lungs: clear to ascultation bilaterally, normal work of respiration, no wheezes, rales, ronchi Heart: regular rate and rhythm, no murmurs, gallops, or rubs Abdomen: soft, non-tender, non-distended, normal bowel sounds Msk: no joint edema, warmth, or erythema Extremities: no cyanosis, clubbing, or edema Neurologic: alert & oriented X3, cranial nerves II-XII intact, strength grossly intact, sensation intact to light touch  Assessment/Plan

## 2011-07-30 ENCOUNTER — Other Ambulatory Visit: Payer: Self-pay | Admitting: Internal Medicine

## 2011-07-30 LAB — COMPLETE METABOLIC PANEL WITH GFR
Albumin: 4.5 g/dL (ref 3.5–5.2)
Alkaline Phosphatase: 72 U/L (ref 39–117)
BUN: 21 mg/dL (ref 6–23)
Calcium: 9.3 mg/dL (ref 8.4–10.5)
Creat: 1.23 mg/dL (ref 0.50–1.35)
GFR, Est Non African American: 69 mL/min
Glucose, Bld: 87 mg/dL (ref 70–99)
Potassium: 4.3 mEq/L (ref 3.5–5.3)

## 2011-07-30 LAB — PROTEIN / CREATININE RATIO, URINE: Creatinine, Urine: 198.2 mg/dL

## 2011-07-31 ENCOUNTER — Emergency Department (HOSPITAL_BASED_OUTPATIENT_CLINIC_OR_DEPARTMENT_OTHER): Payer: 59

## 2011-07-31 ENCOUNTER — Encounter (HOSPITAL_BASED_OUTPATIENT_CLINIC_OR_DEPARTMENT_OTHER): Payer: Self-pay

## 2011-07-31 ENCOUNTER — Emergency Department (HOSPITAL_BASED_OUTPATIENT_CLINIC_OR_DEPARTMENT_OTHER)
Admission: EM | Admit: 2011-07-31 | Discharge: 2011-07-31 | Disposition: A | Discharge status: Home / Self Care | Payer: 59 | Attending: Emergency Medicine | Admitting: Emergency Medicine

## 2011-07-31 ENCOUNTER — Other Ambulatory Visit: Payer: Self-pay

## 2011-07-31 DIAGNOSIS — I119 Hypertensive heart disease without heart failure: Secondary | ICD-10-CM | POA: Insufficient documentation

## 2011-07-31 DIAGNOSIS — R0789 Other chest pain: Secondary | ICD-10-CM

## 2011-07-31 DIAGNOSIS — E785 Hyperlipidemia, unspecified: Secondary | ICD-10-CM | POA: Insufficient documentation

## 2011-07-31 DIAGNOSIS — N189 Chronic kidney disease, unspecified: Secondary | ICD-10-CM | POA: Insufficient documentation

## 2011-07-31 LAB — CBC WITH DIFFERENTIAL/PLATELET
Basophils Absolute: 0 10*3/uL (ref 0.0–0.1)
Basophils Relative: 0 % (ref 0–1)
Eosinophils Absolute: 0 K/uL (ref 0.0–0.7)
Eosinophils Relative: 0 % (ref 0–5)
HCT: 42.8 % (ref 39.0–52.0)
Hemoglobin: 15.2 g/dL (ref 13.0–17.0)
Lymphocytes Relative: 31 % (ref 12–46)
Lymphs Abs: 2.3 K/uL (ref 0.7–4.0)
MCH: 30.2 pg (ref 26.0–34.0)
MCHC: 35.5 g/dL (ref 30.0–36.0)
MCV: 85.1 fL (ref 78.0–100.0)
Monocytes Absolute: 0.6 10*3/uL (ref 0.1–1.0)
Monocytes Relative: 8 % (ref 3–12)
Neutro Abs: 4.4 K/uL (ref 1.7–7.7)
Neutrophils Relative %: 60 % (ref 43–77)
Platelets: 257 10*3/uL (ref 150–400)
RBC: 5.03 MIL/uL (ref 4.22–5.81)
RDW: 13 % (ref 11.5–15.5)
WBC: 7.3 K/uL (ref 4.0–10.5)

## 2011-07-31 LAB — COMPREHENSIVE METABOLIC PANEL WITH GFR
ALT: 21 U/L (ref 0–53)
AST: 18 U/L (ref 0–37)
Albumin: 4.2 g/dL (ref 3.5–5.2)
Alkaline Phosphatase: 82 U/L (ref 39–117)
Chloride: 102 meq/L (ref 96–112)
Potassium: 3.8 meq/L (ref 3.5–5.1)
Sodium: 137 meq/L (ref 135–145)
Total Bilirubin: 0.8 mg/dL (ref 0.3–1.2)
Total Protein: 7.3 g/dL (ref 6.0–8.3)

## 2011-07-31 LAB — CK TOTAL AND CKMB (NOT AT ARMC)
CK, MB: 3.6 ng/mL (ref 0.3–4.0)
Relative Index: 1.6 (ref 0.0–2.5)
Total CK: 232 U/L (ref 7–232)

## 2011-07-31 LAB — COMPREHENSIVE METABOLIC PANEL
BUN: 16 mg/dL (ref 6–23)
CO2: 26 mEq/L (ref 19–32)
Calcium: 9.2 mg/dL (ref 8.4–10.5)
Creatinine, Ser: 1.2 mg/dL (ref 0.50–1.35)
GFR calc Af Amer: 82 mL/min — ABNORMAL LOW (ref >90)
GFR calc non Af Amer: 70 mL/min — ABNORMAL LOW (ref >90)
Glucose, Bld: 103 mg/dL — ABNORMAL HIGH (ref 70–99)

## 2011-07-31 LAB — TROPONIN I: Troponin I: <0.3 ng/mL (ref <0.30)

## 2011-07-31 NOTE — Discharge Instructions (Signed)
Chest Pain, Nonspecific  It is often hard to give a specific diagnosis for the cause of chest pain. There is always a chance that your pain could be related to something serious, like a heart attack or a blood clot in the lungs. You need to follow up with your caregiver for further evaluation. More lab tests or other studies such as X-rays, electrocardiography, stress testing, or cardiac imaging may be needed to find the cause of your pain.  Most of the time, nonspecific chest pain improves within 2 to 3 days with rest and mild pain medicine. For the next few days, avoid physical exertion or activities that bring on pain. Do not smoke. Avoid drinking alcohol. Call your caregiver for routine follow-up as advised.   SEEK IMMEDIATE MEDICAL CARE IF:   You develop increased chest pain or pain that radiates to the arm, neck, jaw, back, or abdomen.   You develop shortness of breath, increased coughing, or you start coughing up blood.   You have severe back or abdominal pain, nausea, or vomiting.   You develop severe weakness, fainting, fever, or chills.  Document Released: 01/05/2005 Document Revised: 12/25/2010 Document Reviewed: 06/25/2006  Exit[employer] Patient Information 2012 [employer]

## 2011-07-31 NOTE — ED Notes (Signed)
Pt reports left chest wall pain radiating to left arm.  Pain is described as intermittent and sharp.  Denies other symptoms. Pain is 2/10 at present time.  Pt took Aspirin 325 mg this am.

## 2011-07-31 NOTE — ED Notes (Signed)
Pt reports left chest wall pain that started 3 days ago while at work.  Pain is described as intermittent and sharp.

## 2011-07-31 NOTE — ED Provider Notes (Signed)
History     CSN: [id number]  Arrival date & time 07/31/11  1237   First MD Initiated Contact with Patient 07/31/11 1409      Chief Complaint  Patient presents with  . Chest Pain    (Consider location/radiation/quality/duration/timing/severity/associated sxs/prior treatment) HPI Comments: Pt with no h/o CAD, takes meds for HTN and hyperlipidemia, exercises regularly including cardiac, doesn't have CP or SOB with exertion.  He has been having brief, sudden sharp episodes of left side CP, non radiating just at left breast, he cannot reproduce it, has acted up several times today so wanted to be checked out.  He does a lot of weight lifting.  Certain bending over at times will make it happen.  No sweats, nausea, SOB.  Not pleuritic, no fever, cough, URI symptoms.  No calf pain or swelling, no recent travel .  He is a non smoker, no obv known family CAD at early age.    Patient is a 46 y.o. male presenting with chest pain. The history is provided by the patient.  Chest Pain Pertinent negatives for primary symptoms include no fever, no shortness of breath, no palpitations, no nausea and no vomiting.     Past Medical History  Diagnosis Date  . Hypertension   . Hyperlipidemia   . Dilated cardiomyopathy     Hx of-resolves on echo 2008-EF 60-70%  . Nephrolithiasis 2012    Calcium oxalate stones  . Chronic renal insufficiency     Baseline Cr around 1.2, likely secondary to HTN    History reviewed. No pertinent past surgical history.  History reviewed. No pertinent family history.  History  Substance Use Topics  . Smoking status: Never Smoker   . Smokeless tobacco: Never Used  . Alcohol Use: No      Review of Systems  Constitutional: Negative for fever and chills.  Respiratory: Negative for chest tightness and shortness of breath.   Cardiovascular: Positive for chest pain. Negative for palpitations and leg swelling.  Gastrointestinal: Negative for nausea and vomiting.    Neurological: Negative for headaches.    Allergies  Review of patient's allergies indicates no known allergies.  Home Medications   Current Outpatient Rx  Name Route Sig Dispense Refill  . AMLODIPINE BESYLATE 10 MG PO TABS Oral Take 1 tablet (10 mg total) by mouth daily. 90 tablet 3  . ASPIRIN 81 MG PO TBEC Oral Take 81 mg by mouth daily.      . ATORVASTATIN CALCIUM 20 MG PO TABS  TAKE 1 TABLET BY MOUTH DAILY 93 tablet 3  . CARVEDILOL 25 MG PO TABS  TAKE 1 TABLET BY MOUTH TWICE DAILY WITH MEALS 186 tablet 3  . ENALAPRIL MALEATE 20 MG PO TABS  TAKE 1 TABLET BY MOUTH TWICE DAILY 186 tablet 3  . ONE-DAILY MULTI VITAMINS PO TABS Oral Take 1 tablet by mouth daily.      [name] FISH OIL 1200 MG PO CAPS Oral Take 1 capsule by mouth 2 (two) times daily.        BP 171/94  Pulse 71  Temp 98.9 F (37.2 C) (Oral)  Resp 16  Ht 5\' 10"  (1.778 m)  Wt 248 lb (112.492 kg)  BMI 35.58 kg/m2  SpO2 100%  Physical Exam  Nursing note and vitals reviewed. Constitutional: He is oriented to person, place, and time. He appears well-developed and well-nourished. No distress.  HENT:  Head: Normocephalic and atraumatic.  Eyes: Conjunctivae and EOM are normal. Pupils are equal, round, and reactive  to light.  Neck: Neck supple.  Cardiovascular: Normal rate and regular rhythm.   Pulmonary/Chest: Effort normal and breath sounds normal. No respiratory distress. He has no wheezes. He has no rales.  Abdominal: Soft. He exhibits no distension. There is no tenderness.  Musculoskeletal: Normal range of motion. He exhibits no edema and no tenderness.  Neurological: He is alert and oriented to person, place, and time.  Skin: Skin is warm. No rash noted. He is not diaphoretic. No pallor.  Psychiatric: He has a normal mood and affect.    ED Course  Procedures (including critical care time)  Labs Reviewed  COMPREHENSIVE METABOLIC PANEL - Abnormal; Notable for the following:    Glucose, Bld 103 (*)     GFR calc non  Af Amer 70 (*)     GFR calc Af Amer 82 (*)     All other components within normal limits  CBC WITH DIFFERENTIAL  CK TOTAL AND CKMB  TROPONIN I   Dg Chest 2 View  07/31/2011  *RADIOLOGY REPORT*  Clinical Data: Left chest pain for 3 days, hypertension  CHEST - 2 VIEW  Comparison: Chest x-ray of 02/08/2004  Findings: The lungs are clear.  Mediastinal contours appear stable. Mild peribronchial thickening is present.  The heart is within normal limits in size. No bony abnormality is seen.  IMPRESSION: No active lung disease.  Mild peribronchial thickening.  Original Report Authenticated By: Juline Patch, M.D.   RA sat is 100% and I interpret to be normal  1. Atypical chest pain    ECG at time 12:50 PM shows NSR at rate 61, normal axis, LVH by voltage, nonspecific T wave and ST abn in inf and lateral leads.  T wave inversions are improved slightly compared to 08/20/01.   MDM  CP is very atypical being so focal and brief in duration.  No CP at this moment.  No ischemic change son ECG, clinically very low suspicion for ACS, troponin is normal.  Pt can follow up with PCP or cardiologist next week as needed.  Pt is offered reassurance, reason to return for re-evaluation.          Robert White. Holmes Hays, MD 07/31/11 1542

## 2012-03-20 ENCOUNTER — Other Ambulatory Visit: Payer: Self-pay | Admitting: Internal Medicine

## 2012-03-29 ENCOUNTER — Other Ambulatory Visit: Payer: Self-pay | Admitting: Internal Medicine

## 2012-04-14 ENCOUNTER — Other Ambulatory Visit: Payer: Self-pay | Admitting: Internal Medicine

## 2012-04-14 ENCOUNTER — Encounter: Payer: Self-pay | Admitting: Internal Medicine

## 2012-04-14 NOTE — Telephone Encounter (Signed)
Overdue for appointment 

## 2012-04-28 ENCOUNTER — Other Ambulatory Visit: Payer: Self-pay | Admitting: Internal Medicine

## 2012-07-04 ENCOUNTER — Other Ambulatory Visit: Payer: Self-pay | Admitting: Internal Medicine

## 2012-07-13 ENCOUNTER — Ambulatory Visit (INDEPENDENT_AMBULATORY_CARE_PROVIDER_SITE_OTHER): Payer: 59 | Admitting: Internal Medicine

## 2012-07-13 ENCOUNTER — Encounter: Payer: Self-pay | Admitting: Internal Medicine

## 2012-07-13 VITALS — BP 133/88 | HR 79 | Temp 97.5°F | Ht 70.0 in | Wt 271.6 lb

## 2012-07-13 DIAGNOSIS — L816 Other disorders of diminished melanin formation: Secondary | ICD-10-CM | POA: Insufficient documentation

## 2012-07-13 DIAGNOSIS — L819 Disorder of pigmentation, unspecified: Secondary | ICD-10-CM

## 2012-07-13 DIAGNOSIS — N182 Chronic kidney disease, stage 2 (mild): Secondary | ICD-10-CM

## 2012-07-13 DIAGNOSIS — I10 Essential (primary) hypertension: Secondary | ICD-10-CM

## 2012-07-13 LAB — CBC
HCT: 45 % (ref 39.0–52.0)
MCV: 88.9 fL (ref 78.0–100.0)
Platelets: 286 10*3/uL (ref 150–400)
RBC: 5.06 MIL/uL (ref 4.22–5.81)
WBC: 6.2 10*3/uL (ref 4.0–10.5)

## 2012-07-13 LAB — COMPLETE METABOLIC PANEL WITH GFR
AST: 19 U/L (ref 0–37)
Alkaline Phosphatase: 95 U/L (ref 39–117)
BUN: 20 mg/dL (ref 6–23)
Creat: 1.21 mg/dL (ref 0.50–1.35)

## 2012-07-13 NOTE — Progress Notes (Signed)
HPI The patient is a 47 y.o. male with a history of HTN, HL, CKD, presenting for a yearly follow-up.  The patient notes no acute complaints.  He notes that his daughter just graduated from [employer], and has a few job offers.  The patient has a history of HTN.  BP is well-controlled today.  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  The patient asks about an area of hypopigmentation on his mid-lower lip, which occurred 6 months ago.  At the onset of this, he noted some lip swelling and pain, which went away within a few days.  Since then, the area has been asymptomatic.  He notes that he believes some pigment is returning slowly.  The area is not bothersome to the patient.  ROS: General: no fevers, chills, changes in weight, changes in appetite Skin: see HPI HEENT: no blurry vision, hearing changes, sore throat Pulm: no dyspnea, coughing, wheezing CV: no chest pain, palpitations, shortness of breath Abd: no abdominal pain, nausea/vomiting, diarrhea/constipation GU: no dysuria, hematuria, polyuria Ext: no arthralgias, myalgias Neuro: no weakness, numbness, or tingling  Filed Vitals:   07/13/12 0840  BP: 133/88  Pulse: 79  Temp: 97.5 F (36.4 C)    PEX General: alert, cooperative, and in no apparent distress HEENT: pupils equal round and reactive to light, vision grossly intact, oropharynx clear and non-erythematous. Mid-lower lip with approx 1 cm wide strip of hypopigmentation Neck: supple, no lymphadenopathy Lungs: clear to ascultation bilaterally, normal work of respiration, no wheezes, rales, ronchi Heart: regular rate and rhythm, no murmurs, gallops, or rubs Abdomen: soft, non-tender, non-distended, normal bowel sounds Extremities: no cyanosis, clubbing, or edema Neurologic: alert & oriented X3, cranial nerves II-XII intact, strength grossly intact, sensation intact to light touch  Current Outpatient Prescriptions on File Prior to Visit   Medication Sig Dispense Refill  . amLODipine (NORVASC) 10 MG tablet TAKE 1 TABLET BY MOUTH EVERY DAY  90 tablet  3  . aspirin 81 MG EC tablet Take 81 mg by mouth daily.        [name] atorvastatin (LIPITOR) 20 MG tablet TAKE 1 TABLET BY MOUTH EVERY DAY  30 tablet  11  . carvedilol (COREG) 25 MG tablet TAKE 1 TABLET BY MOUTH TWICE A DAY WITH MEALS  60 tablet  11  . enalapril (VASOTEC) 20 MG tablet TAKE 1 TABLET BY MOUTH TWICE A DAY  68 tablet  11  . Multiple Vitamin (MULTIVITAMIN) tablet Take 1 tablet by mouth daily.        . Omega-3 Fatty Acids (FISH OIL) 1200 MG CAPS Take 1 capsule by mouth 2 (two) times daily.         No current facility-administered medications on file prior to visit.    Assessment/Plan

## 2012-07-13 NOTE — Assessment & Plan Note (Signed)
The patient has a history of CKD, baseline Cr around 1.2, likely secondary to HTN. -recheck BMET today

## 2012-07-13 NOTE — Patient Instructions (Signed)
General Instructions: Today, we are checking some routine labs.  If anything is abnormal, I will call you with the results.  If you do not hear from me, you can assume that the results were normal.  Please return for a follow-up visit in 1 year.   Treatment Goals:  Goals (1 Years of Data) as of 07/13/12   None      Progress Toward Treatment Goals:  Treatment Goal 07/13/2012  Blood pressure at goal    Self Care Goals & Plans:  Self Care Goal 07/13/2012  Manage my medications take my medicines as prescribed; bring my medications to every visit; refill my medications on time  Monitor my health keep track of my weight  Eat healthy foods eat foods that are low in salt  Be physically active take a walk every day       Care Management & Community Referrals:  Referral 07/13/2012  Referrals made for care management support none needed

## 2012-07-13 NOTE — Assessment & Plan Note (Signed)
The patient notes hypopigmentation of his mid lower-lip for the last 6 months, preceded by lip pain and swelling.  The etiology is unclear, may have represented Vitiligo vs ?tinea versecolor.  Symptoms are presently not bothersome to the patient  -if symptoms become bothersome, can consider referral to dermatology vs empiric treatment for vitiligo (ie topical corticosteroids, etc.)

## 2012-07-13 NOTE — Assessment & Plan Note (Signed)
BP Readings from Last 3 Encounters:  07/13/12 133/88  07/31/11 171/94  07/29/11 150/89    Lab Results  Component Value Date   NA 137 07/31/2011   K 3.8 07/31/2011   CREATININE 1.20 07/31/2011    Assessment: Blood pressure control: controlled Progress toward BP goal:  at goal Comments: BP well-controlled  Plan: Medications:  continue current medications Educational resources provided:   Self management tools provided:   Other plans: Recheck at next visit

## 2012-07-20 NOTE — Progress Notes (Signed)
Case discussed with Dr. Brown at the time of the visit.  We reviewed the resident's history and exam and pertinent patient test results.  I agree with the assessment, diagnosis, and plan of care documented in the resident's note. 

## 2013-04-04 ENCOUNTER — Other Ambulatory Visit: Payer: Self-pay | Admitting: Internal Medicine

## 2013-04-11 ENCOUNTER — Other Ambulatory Visit: Payer: Self-pay | Admitting: Internal Medicine

## 2013-05-09 IMAGING — CR DG CHEST 2V
2 series · 2 of 2 positions shown · non-contrast
Comparison: Chest x-ray of 02/08/2004

CLINICAL DATA: Left chest pain for 3 days, hypertension

CHEST - 2 VIEW

[w chest pa]
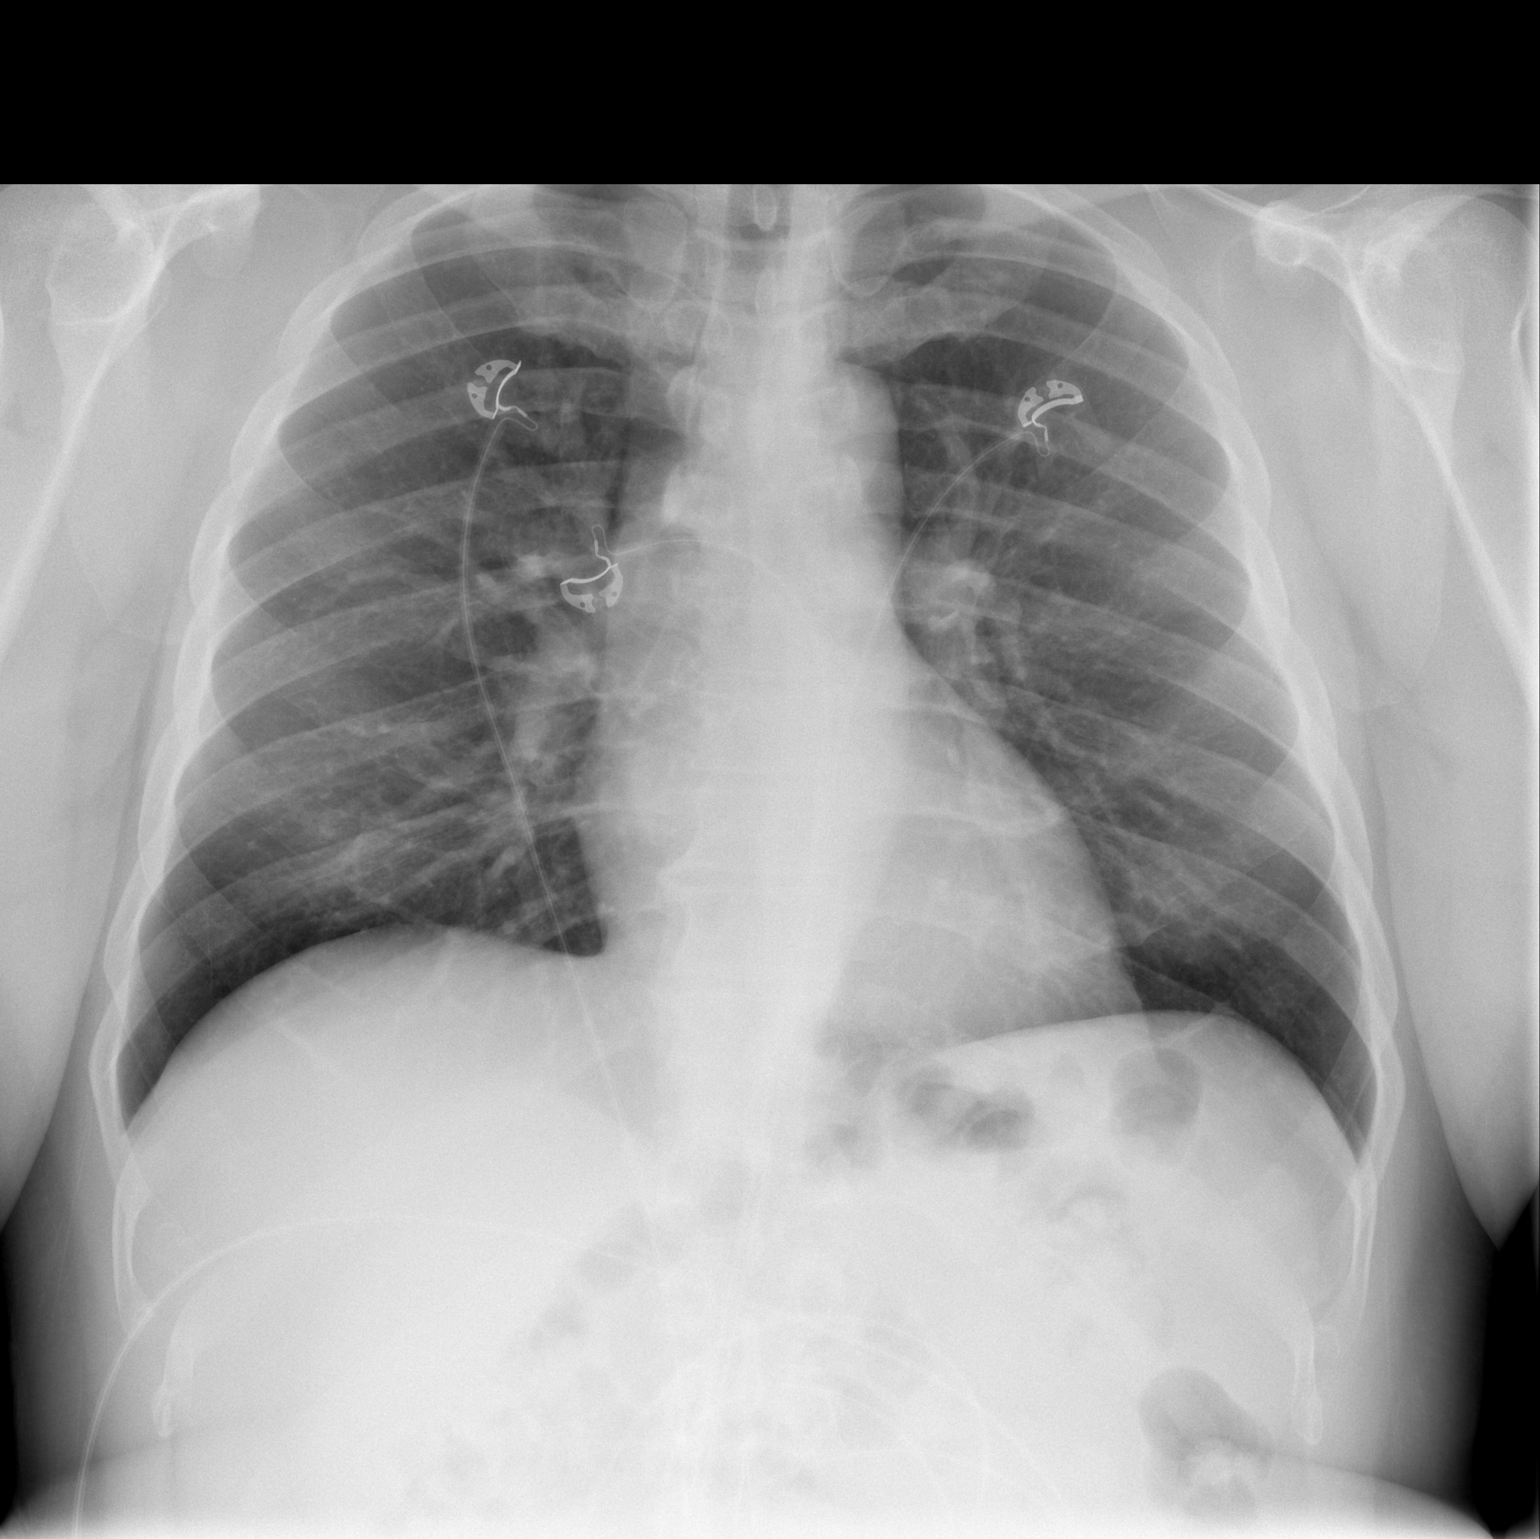

[w chest lat]
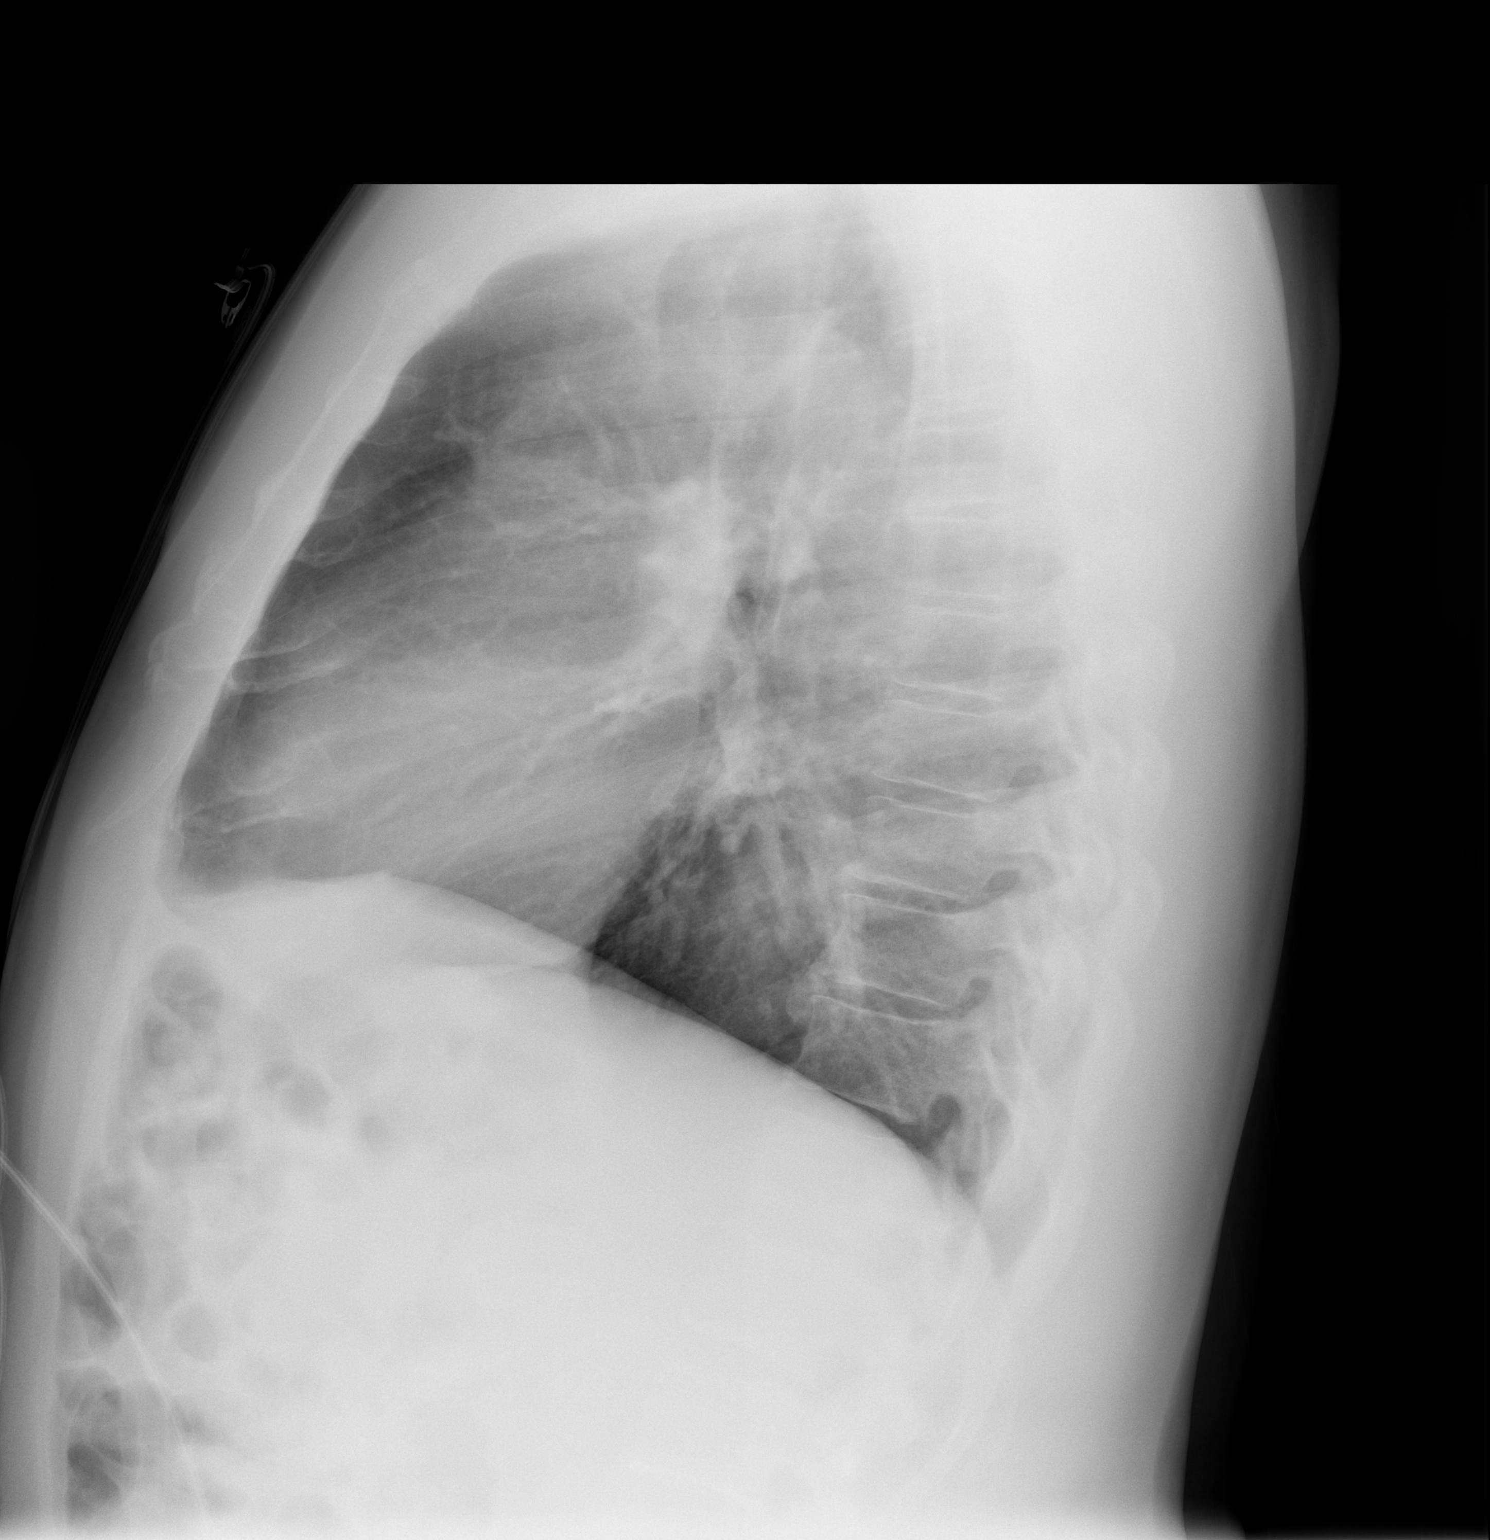

[2 of 2 positions shown; findings below may reference images not displayed]

FINDINGS: The lungs are clear.  Mediastinal contours appear stable.
Mild peribronchial thickening is present.  The heart is within
normal limits in size. No bony abnormality is seen.
IMPRESSION: No active lung disease.  Mild peribronchial thickening.

## 2013-05-27 ENCOUNTER — Other Ambulatory Visit: Payer: Self-pay | Admitting: Internal Medicine

## 2013-06-15 ENCOUNTER — Other Ambulatory Visit: Payer: Self-pay | Admitting: Internal Medicine

## 2013-06-21 ENCOUNTER — Encounter: Payer: Self-pay | Admitting: Internal Medicine

## 2013-06-21 ENCOUNTER — Ambulatory Visit (INDEPENDENT_AMBULATORY_CARE_PROVIDER_SITE_OTHER): Payer: 59 | Admitting: Internal Medicine

## 2013-06-21 VITALS — BP 134/84 | HR 64 | Temp 97.2°F | Ht 70.0 in | Wt 271.3 lb

## 2013-06-21 DIAGNOSIS — N529 Male erectile dysfunction, unspecified: Secondary | ICD-10-CM

## 2013-06-21 DIAGNOSIS — I10 Essential (primary) hypertension: Secondary | ICD-10-CM

## 2013-06-21 LAB — BASIC METABOLIC PANEL
BUN: 17 mg/dL (ref 6–23)
CALCIUM: 9.3 mg/dL (ref 8.4–10.5)
CO2: 28 mEq/L (ref 19–32)
Chloride: 103 mEq/L (ref 96–112)
Creat: 1.1 mg/dL (ref 0.50–1.35)
GLUCOSE: 79 mg/dL (ref 70–99)
Potassium: 4 mEq/L (ref 3.5–5.3)
SODIUM: 138 meq/L (ref 135–145)

## 2013-06-21 LAB — CBC
HEMATOCRIT: 43.9 % (ref 39.0–52.0)
Hemoglobin: 14.7 g/dL (ref 13.0–17.0)
MCH: 28.8 pg (ref 26.0–34.0)
MCHC: 33.5 g/dL (ref 30.0–36.0)
MCV: 85.9 fL (ref 78.0–100.0)
PLATELETS: 293 10*3/uL (ref 150–400)
RBC: 5.11 MIL/uL (ref 4.22–5.81)
RDW: 13.7 % (ref 11.5–15.5)
WBC: 6.4 10*3/uL (ref 4.0–10.5)

## 2013-06-21 MED ORDER — TADALAFIL 10 MG PO TABS: 10.0000 mg | ORAL_TABLET | ORAL | Status: DC | PRN

## 2013-06-21 NOTE — Assessment & Plan Note (Signed)
BP Readings from Last 3 Encounters:  06/21/13 134/84  07/13/12 133/88  07/31/11 171/94    Lab Results  Component Value Date   NA 139 07/13/2012   K 4.3 07/13/2012   CREATININE 1.21 07/13/2012    Assessment: Blood pressure control: controlled Progress toward BP goal:  at goal Comments: BP well-controlled.  Pt is interested in eventually losing weight to decrease the number of BP meds he is taking.  Plan: Medications:  Continue amlodipine 10, enalapril 40, coreg 25 BID Educational resources provided:   Self management tools provided:   Other plans: Check BMET, CBC today

## 2013-06-21 NOTE — Patient Instructions (Signed)
General Instructions: Your blood pressure is well controlled today.  We are starting Cialis, 10 mg tablets.  Take 1 tablet 30-60 minutes before sexual activity.  Do not take more than 1 tablet in a day.  To transfer your care to another provider: 1. First, call the new provider to ensure they are taking new patients, and make your first appointment. 2. Call your insurance company, to change the information for your Primary Care Provider 3. Then, call our clinic to let us know that you have changed providers, and request a copy of your records to be sent to your new practice  Until you establish with a new clinic, we are happy to continue seeing you.  You can return for a routine follow-up visit in 1 year.  Thank you for bringing your medicines today. This helps [place] keep you safe from mistakes.   Progress Toward Treatment Goals:  Treatment Goal 06/21/2013  Blood pressure at goal    Self Care Goals & Plans:  Self Care Goal 07/13/2012  Manage my medications take my medicines as prescribed; bring my medications to every visit; refill my medications on time  Monitor my health keep track of my weight  Eat healthy foods eat foods that are low in salt  Be physically active take a walk every day    No flowsheet data found.   Care Management & Community Referrals:  Referral 06/21/2013  Referrals made for care management support none needed

## 2013-06-21 NOTE — Assessment & Plan Note (Signed)
Given his symptoms of ED, with no red flag symptoms, we will start treatment with Cialis.  The patient was cautioned on its contra-indication with nitro. -start Cialis 10 mg tablets daily prn

## 2013-06-21 NOTE — Progress Notes (Signed)
HPI The patient is a 48 y.o. male with a history of HTN, CKD, presenting for a routine follow-up visit for HTN.  The patient's BP is well-controlled today.  He notes starting a work-out regimen recently involving weight lifting and spin class.  He is interested in losing more weight through diet/exercise.  The patient notes progressive symptoms of difficulty achieving an erection.  He notes no difficulty initiating urination, dribbling, or incomplete voiding.  He notes no unintentional weight loss or night sweats.  He has no history of CAD, and takes no nitro.  The patient recently saw a dermatologist for the area of hypopigmentation on his lower lip.  He cannot recall the diagnosis, but stated he was given a cream to use on the area, which he believes is starting to improve the area somewhat.  The patient is interested in transferring care from our clinic to a clinic in [facility], due to its closer location to his home.  ROS: General: no fevers, chills, changes in weight, changes in appetite Skin: no rash HEENT: no blurry vision, hearing changes, sore throat Pulm: no dyspnea, coughing, wheezing CV: no chest pain, palpitations, shortness of breath Abd: no abdominal pain, nausea/vomiting, diarrhea/constipation GU: no dysuria, hematuria, polyuria Ext: no arthralgias, myalgias Neuro: no weakness, numbness, or tingling  Filed Vitals:   06/21/13 1555  BP: 134/84  Pulse: 64  Temp: 97.2 F (36.2 C)    PEX General: alert, cooperative, and in no apparent distress HEENT: pupils equal round and reactive to light, vision grossly intact, oropharynx clear and non-erythematous  Neck: supple, thyroid mobile and non-tender without nodules Lungs: clear to ascultation bilaterally, normal work of respiration, no wheezes, rales, ronchi Heart: regular rate and rhythm, no murmurs, gallops, or rubs Abdomen: soft, non-tender, non-distended, normal bowel sounds Extremities: no cyanosis, clubbing, or  edema Neurologic: alert & oriented X3, cranial nerves II-XII intact, strength grossly intact, sensation intact to light touch  Current Outpatient Prescriptions on File Prior to Visit  Medication Sig Dispense Refill  . amLODipine (NORVASC) 10 MG tablet Take 1 tablet (10 mg total) by mouth daily.  90 tablet  3  . aspirin 81 MG EC tablet Take 81 mg by mouth daily.        [name] atorvastatin (LIPITOR) 20 MG tablet Take 1 tablet (20 mg total) by mouth daily at 6 PM.  30 tablet  11  . carvedilol (COREG) 25 MG tablet Take 1 tablet (25 mg total) by mouth 2 (two) times daily with a meal.  60 tablet  11  . enalapril (VASOTEC) 20 MG tablet Take 1 tablet (20 mg total) by mouth 2 (two) times daily.  68 tablet  11  . Multiple Vitamin (MULTIVITAMIN) tablet Take 1 tablet by mouth daily.        . Omega-3 Fatty Acids (FISH OIL) 1200 MG CAPS Take 1 capsule by mouth 2 (two) times daily.         No current facility-administered medications on file prior to visit.    Assessment/Plan

## 2013-06-23 ENCOUNTER — Telehealth: Payer: Self-pay | Admitting: *Deleted

## 2013-06-23 NOTE — Telephone Encounter (Signed)
Received call from Robert White yesterday afternoon - stated Cialis too expensive; cost over $500.00. The pharmacist told her the doctor could give her husband a coupon for 'first 3 pills will be free then discount for the 7 other pills". Dr Robert Shark do u know about this coupon?  Thanks

## 2013-06-26 NOTE — Telephone Encounter (Signed)
I wasn't familiar with this program, but after visiting cialis.com, it appears they do have a promotion of this sort.  The patient can visit this website and print out this voucher himself if desired.  However, I've also printed out a copy for him, and mailed it to him today.

## 2013-06-27 NOTE — Telephone Encounter (Signed)
Pt called/informed of couponbeing mailed per Dr Owens Shark.

## 2013-06-28 NOTE — Progress Notes (Signed)
Case discussed with Dr. Brown at the time of the visit.  We reviewed the resident's history and exam and pertinent patient test results.  I agree with the assessment, diagnosis, and plan of care documented in the resident's note. 

## 2013-12-27 ENCOUNTER — Other Ambulatory Visit: Payer: Self-pay | Admitting: *Deleted

## 2013-12-27 MED ORDER — CARVEDILOL 25 MG PO TABS: 25.0000 mg | ORAL_TABLET | Freq: Two times a day (BID) | ORAL | Status: DC

## 2013-12-27 MED ORDER — ATORVASTATIN CALCIUM 20 MG PO TABS: 20.0000 mg | ORAL_TABLET | Freq: Every day | ORAL | Status: DC

## 2013-12-27 NOTE — Telephone Encounter (Signed)
[facility] needs authorization to dispense 90 day supply. Hilda Blades Dirzler RN 12/27/13 10:30AM

## 2014-03-30 ENCOUNTER — Other Ambulatory Visit: Payer: Self-pay | Admitting: Internal Medicine

## 2014-03-30 NOTE — Telephone Encounter (Signed)
Please schedule an appt visit - Patient has not been seen in almost 1 year. Giving 1 months supply.

## 2014-04-01 ENCOUNTER — Other Ambulatory Visit: Payer: Self-pay | Admitting: Internal Medicine

## 2014-04-26 ENCOUNTER — Other Ambulatory Visit: Payer: Self-pay | Admitting: Internal Medicine

## 2014-05-02 ENCOUNTER — Encounter: Payer: Self-pay | Admitting: Internal Medicine

## 2014-05-16 ENCOUNTER — Encounter: Payer: Self-pay | Admitting: *Deleted

## 2014-06-03 ENCOUNTER — Other Ambulatory Visit: Payer: Self-pay | Admitting: Internal Medicine

## 2014-06-06 ENCOUNTER — Other Ambulatory Visit: Payer: Self-pay | Admitting: Internal Medicine

## 2014-06-07 ENCOUNTER — Other Ambulatory Visit: Payer: Self-pay | Admitting: *Deleted

## 2014-06-07 MED ORDER — ENALAPRIL MALEATE 20 MG PO TABS: 20.0000 mg | ORAL_TABLET | Freq: Two times a day (BID) | ORAL | Status: DC

## 2014-06-08 NOTE — Telephone Encounter (Signed)
Pt has appointment 6/1

## 2014-06-12 ENCOUNTER — Other Ambulatory Visit: Payer: Self-pay | Admitting: *Deleted

## 2014-06-12 MED ORDER — AMLODIPINE BESYLATE 10 MG PO TABS: 10.0000 mg | ORAL_TABLET | Freq: Every day | ORAL | Status: DC

## 2014-06-20 ENCOUNTER — Ambulatory Visit (INDEPENDENT_AMBULATORY_CARE_PROVIDER_SITE_OTHER): Payer: 59 | Admitting: Internal Medicine

## 2014-06-20 ENCOUNTER — Encounter: Payer: Self-pay | Admitting: Internal Medicine

## 2014-06-20 VITALS — BP 137/77 | HR 61 | Temp 98.1°F | Ht 70.0 in | Wt 267.5 lb

## 2014-06-20 DIAGNOSIS — Z0000 Encounter for general adult medical examination without abnormal findings: Secondary | ICD-10-CM

## 2014-06-20 DIAGNOSIS — K130 Diseases of lips: Secondary | ICD-10-CM

## 2014-06-20 DIAGNOSIS — I10 Essential (primary) hypertension: Secondary | ICD-10-CM

## 2014-06-20 DIAGNOSIS — R220 Localized swelling, mass and lump, head: Secondary | ICD-10-CM

## 2014-06-20 LAB — BASIC METABOLIC PANEL WITH GFR
BUN: 18 mg/dL (ref 6–23)
CHLORIDE: 103 meq/L (ref 96–112)
CO2: 27 meq/L (ref 19–32)
Calcium: 8.9 mg/dL (ref 8.4–10.5)
Creat: 1.19 mg/dL (ref 0.50–1.35)
GFR, EST AFRICAN AMERICAN: 82 mL/min
GFR, Est Non African American: 71 mL/min
Glucose, Bld: 85 mg/dL (ref 70–99)
Potassium: 4.3 mEq/L (ref 3.5–5.3)
SODIUM: 137 meq/L (ref 135–145)

## 2014-06-20 NOTE — Patient Instructions (Signed)
It was a pleasure to see you today. We will call you if there are any issues with your blood work. We have given you a referral to GI for colonoscopy. Please return to clinic or seek medical attention if you have any new or worsening trouble breathing or swallowing, or lip swelling, (stop taking the enalapril if this happens) or other worrisome medical condition. We look forward to seeing you again in one year.  Lottie Mussel, MD  General Instructions:   Thank you for bringing your medicines today. This helps [place] keep you safe from mistakes.   Progress Toward Treatment Goals:  Treatment Goal 06/20/2014  Blood pressure at goal    Self Care Goals & Plans:  Self Care Goal 06/20/2014  Manage my medications take my medicines as prescribed; bring my medications to every visit; refill my medications on time  Monitor my health -  Eat healthy foods drink diet soda or water instead of juice or soda; eat more vegetables; eat foods that are low in salt; eat baked foods instead of fried foods  Be physically active (No Data)    No flowsheet data found.   Care Management & Community Referrals:  Referral 06/21/2013  Referrals made for care management support none needed

## 2014-06-20 NOTE — Assessment & Plan Note (Addendum)
Patient agrees to colonoscopy referral and gives verbal consent for HIV screening  ADDENDUM: HIV negative   Robert Mussel, MD Internal Medicine, PGY-1 Pager [phone] 06/21/2014, 7:21 AM

## 2014-06-20 NOTE — Progress Notes (Signed)
   Subjective:    Patient ID: Robert White, male    DOB: 1965/10/11, 49 y.o.   MRN: [id number]  HPI  Robert White is a 49 year old man with HTN, CKD, HL who presents for follow-up on his chronic co-morbidities. Please see problem-based charting assessment and plan note for further details of medical issues addressed at today's visit.   Review of Systems  Constitutional: Negative for fever, chills and diaphoresis.  Respiratory: Negative for cough and shortness of breath.   Cardiovascular: Negative for chest pain.  Gastrointestinal: Negative for nausea, vomiting, abdominal pain, diarrhea and constipation.  Neurological: Negative for weakness, light-headedness, numbness and headaches.       Objective:   Physical Exam  Constitutional: He is oriented to person, place, and time. He appears well-developed and well-nourished. No distress.  HENT:  Head: Normocephalic and atraumatic.  Mouth/Throat: Oropharynx is clear and moist.  Hypopigmentation of mid lower lip  Eyes: EOM are normal. Pupils are equal, round, and reactive to light.  Cardiovascular: Normal rate, regular rhythm, normal heart sounds and intact distal pulses.  Exam reveals no gallop and no friction rub.   No murmur heard. Pulmonary/Chest: Effort normal and breath sounds normal. No respiratory distress. He has no wheezes.  Abdominal: Soft. Bowel sounds are normal. He exhibits no distension. There is no tenderness.  Musculoskeletal: He exhibits no edema.  Neurological: He is alert and oriented to person, place, and time.  Skin: He is not diaphoretic.  Vitals reviewed.         Assessment & Plan:

## 2014-06-20 NOTE — Assessment & Plan Note (Signed)
Mr Hennes notes that about threee months ago he went to an urgent care for swelling of the L portion of his lower lip. He had no trouble swallowing or breathing.He was given some medicine (he is unsure what) and it resolved on its own for a day. He says he has no episodes since. He has been taking enalaprilsince 2012 and continued to after this episode without recurrence so this is unlikely to be due to ACEi. -clear return precautions to stop taking enalapril and seek immediate medical attention if any recurrent episode or facial swelling or trouble breathing or swallowing.

## 2014-06-20 NOTE — Assessment & Plan Note (Addendum)
BP Readings from Last 3 Encounters:  06/20/14 137/77  06/21/13 134/84  07/13/12 133/88    Lab Results  Component Value Date   NA 138 06/21/2013   K 4.0 06/21/2013   CREATININE 1.10 06/21/2013    Assessment: Blood pressure control: controlled Progress toward BP goal:  at goal Comments: Well controlled on enalapril 20 mg bid, amlodipine 10 mg daily, coreg 25 mg bid. He also notes that he has lost about 30 lbs in the past few months by exercising nearly every day doing cycling classes and the elliptical as well as eating healthy such as salads for lunch everyday. He feels great.  Plan: Medications:  continue current medications. Patient instructed if he has any swelling of lip or face, or trouble breathing or swallowing to stop taking enalapril and seek medical care immediately. Educational resources provided: handout Self management tools provided:   Other plans: BMP  ADDENDUM: BMP eletrolytes wnl. No change from above.   Lottie Mussel, MD Internal Medicine, PGY-1 Pager [phone] 06/21/2014, 7:21 AM

## 2014-06-21 LAB — HIV ANTIBODY (ROUTINE TESTING W REFLEX): HIV 1&2 Ab, 4th Generation: NONREACTIVE

## 2014-06-21 NOTE — Progress Notes (Signed)
Internal Medicine Clinic Attending  Case discussed with Dr. Rothman at the time of the visit.  We reviewed the resident's history and exam and pertinent patient test results.  I agree with the assessment, diagnosis, and plan of care documented in the resident's note. 

## 2014-07-02 ENCOUNTER — Encounter: Payer: Self-pay | Admitting: Internal Medicine

## 2014-07-25 ENCOUNTER — Other Ambulatory Visit: Payer: Self-pay | Admitting: Internal Medicine

## 2014-08-04 ENCOUNTER — Other Ambulatory Visit: Payer: Self-pay | Admitting: Internal Medicine

## 2014-08-27 ENCOUNTER — Ambulatory Visit (AMBULATORY_SURGERY_CENTER): Payer: Self-pay | Admitting: *Deleted

## 2014-08-27 VITALS — Ht 70.0 in | Wt 272.0 lb

## 2014-08-27 DIAGNOSIS — Z1211 Encounter for screening for malignant neoplasm of colon: Secondary | ICD-10-CM

## 2014-08-27 NOTE — Progress Notes (Signed)
Patient denies any allergies to eggs or soy. Patient denies any problems with anesthesia/sedation. Patient denies any oxygen use at home and does not take any diet/weight loss medications. EMMI education assisgned to patient on colonoscopy, this was explained and instructions given to patient. 

## 2014-09-05 ENCOUNTER — Other Ambulatory Visit: Payer: Self-pay | Admitting: Internal Medicine

## 2014-09-08 ENCOUNTER — Other Ambulatory Visit: Payer: Self-pay | Admitting: Internal Medicine

## 2014-09-08 DIAGNOSIS — I10 Essential (primary) hypertension: Secondary | ICD-10-CM

## 2014-09-10 ENCOUNTER — Ambulatory Visit (AMBULATORY_SURGERY_CENTER): Payer: 59 | Admitting: Internal Medicine

## 2014-09-10 ENCOUNTER — Encounter: Payer: Self-pay | Admitting: Internal Medicine

## 2014-09-10 VITALS — BP 112/70 | HR 58 | Temp 97.7°F | Resp 17 | Ht 70.0 in | Wt 272.0 lb

## 2014-09-10 DIAGNOSIS — Z1211 Encounter for screening for malignant neoplasm of colon: Secondary | ICD-10-CM | POA: Diagnosis not present

## 2014-09-10 DIAGNOSIS — D123 Benign neoplasm of transverse colon: Secondary | ICD-10-CM

## 2014-09-10 MED ORDER — SODIUM CHLORIDE 0.9 % IV SOLN: 500.0000 mL | INTRAVENOUS | Status: DC

## 2014-09-10 NOTE — Progress Notes (Signed)
Called to room to assist during endoscopic procedure.  Patient ID and intended procedure confirmed with present staff. Received instructions for my participation in the procedure from the performing physician.  

## 2014-09-10 NOTE — Progress Notes (Signed)
Transferred to recovery room. A/O x3, pleased with MAC.  VSS.  Report to Penny, RN. 

## 2014-09-10 NOTE — Patient Instructions (Signed)
YOU HAD AN ENDOSCOPIC PROCEDURE TODAY AT [facility]:   Refer to the procedure report that was given to you for any specific questions about what was found during the examination.  If the procedure report does not answer your questions, please call your gastroenterologist to clarify.  If you requested that your care partner not be given the details of your procedure findings, then the procedure report has been included in a sealed envelope for you to review at your convenience later.  YOU SHOULD EXPECT: Some feelings of bloating in the abdomen. Passage of more gas than usual.  Walking can help get rid of the air that was put into your GI tract during the procedure and reduce the bloating. If you had a lower endoscopy (such as a colonoscopy or flexible sigmoidoscopy) you may notice spotting of blood in your stool or on the toilet paper. If you underwent a bowel prep for your procedure, you may not have a normal bowel movement for a few days.  Please Note:  You might notice some irritation and congestion in your nose or some drainage.  This is from the oxygen used during your procedure.  There is no need for concern and it should clear up in a day or so.  SYMPTOMS TO REPORT IMMEDIATELY:   Following lower endoscopy (colonoscopy or flexible sigmoidoscopy):  Excessive amounts of blood in the stool  Significant tenderness or worsening of abdominal pains  Swelling of the abdomen that is new, acute  Fever of 100F or higher    For urgent or emergent issues, a gastroenterologist can be reached at any hour by calling [phone].   DIET: Your first meal following the procedure should be a small meal and then it is ok to progress to your normal diet. Heavy or fried foods are harder to digest and may make you feel nauseous or bloated.  Likewise, meals heavy in dairy and vegetables can increase bloating.  Drink plenty of fluids but you should avoid alcoholic beverages for 24  hours.  ACTIVITY:  You should plan to take it easy for the rest of today and you should NOT DRIVE or use heavy machinery until tomorrow (because of the sedation medicines used during the test).    FOLLOW UP: Our staff will call the number listed on your records the next business day following your procedure to check on you and address any questions or concerns that you may have regarding the information given to you following your procedure. If we do not reach you, we will leave a message.  However, if you are feeling well and you are not experiencing any problems, there is no need to return our call.  We will assume that you have returned to your regular daily activities without incident.  If any biopsies were taken you will be contacted by phone or by letter within the next 1-3 weeks.  Please call us at [phone] if you have not heard about the biopsies in 3 weeks.    SIGNATURES/CONFIDENTIALITY: You and/or your care partner have signed paperwork which will be entered into your electronic medical record.  These signatures attest to the fact that that the information above on your After Visit Summary has been reviewed and is understood.  Full responsibility of the confidentiality of this discharge information lies with you and/or your care-partner.   INFORMATION ON POLYPS ,DIVERTICULOSIS,& HIGH FIBER DIET GIVEN TO YOU TODAY

## 2014-09-10 NOTE — Op Note (Signed)
[facility]  [address] [place], [id number]   COLONOSCOPY PROCEDURE REPORT  PATIENT: Robert White, Robert White  MR#: [id number] BIRTHDATE: 28-Aug-1965 , 48  yrs. old GENDER: male ENDOSCOPIST: Jerene Bears, MD REFERRED BY: Lottie Mussel, MD PROCEDURE DATE:  09/10/2014 PROCEDURE:   Colonoscopy, screening and Colonoscopy with snare polypectomy First Screening Colonoscopy - Avg.  risk and is 50 yrs.  old or older Yes.  Prior Negative Screening - Now for repeat screening. N/A  History of Adenoma - Now for follow-up colonoscopy & has been > or = to 3 yrs.  N/A  Polyps removed today? Yes Polyps Removed Today ASA CLASS:   Class II INDICATIONS:Screening for colonic neoplasia and Colorectal Neoplasm Risk Assessment for this procedure is average risk. MEDICATIONS: Propofol 250 mg IV and Monitored anesthesia care  DESCRIPTION OF PROCEDURE:   After the risks benefits and alternatives of the procedure were thoroughly explained, informed consent was obtained.  The digital rectal exam revealed no rectal mass.   The LB [id number]  endoscope was introduced through the anus and advanced to the terminal ileum which was intubated for a short distance. No adverse events experienced.   The quality of the prep was good.  (Suprep was used)  The instrument was then slowly withdrawn as the colon was fully examined. Estimated blood loss is zero unless otherwise noted in this procedure report.  COLON FINDINGS: The examined terminal ileum appeared to be normal. A sessile polyp measuring 5 mm in size was found in the distal transverse colon.  A polypectomy was performed with a cold snare. The resection was complete, the polyp tissue was completely retrieved and sent to histology.   There was mild diverticulosis noted in the left colon with associated muscular hypertrophy. Retroflexed views revealed internal hemorrhoids. The time to cecum = 2.2 Withdrawal time = 12.5   The scope was withdrawn and  the procedure completed. COMPLICATIONS: There were no immediate complications.  ENDOSCOPIC IMPRESSION: 1.   The examined terminal ileum appeared to be normal 2.   Sessile polyp was found in the distal transverse colon; polypectomy was performed with a cold snare 3.   There was mild diverticulosis noted in the left colon  RECOMMENDATIONS: 1.  Await pathology results 2.  High fiber diet 3.  If the polyp removed today is proven to be an adenomatous (pre-cancerous) polyp, you will need a repeat colonoscopy in 5 years.  Otherwise you should continue to follow colorectal cancer screening guidelines for "routine risk" patients with colonoscopy in 10 years.  You will receive a letter within 1-2 weeks with the results of your biopsy as well as final recommendations.  Please call my office if you have not received a letter after 3 weeks.  eSigned:  Jerene Bears, MD 09/10/2014 11:35 AM cc: the patient, Georgia Lopes, MD

## 2014-09-11 ENCOUNTER — Telehealth: Payer: Self-pay | Admitting: Emergency Medicine

## 2014-09-11 NOTE — Telephone Encounter (Signed)
  Follow up Call-  Call back number 09/10/2014  Post procedure Call Back phone  # [phone]  Permission to leave phone message Yes     Patient questions:  Do you have a fever, pain , or abdominal swelling? No. Pain Score  0 *  Have you tolerated food without any problems? Yes.    Have you been able to return to your normal activities? Yes.    Do you have any questions about your discharge instructions: Diet   No. Medications  No. Follow up visit  No.  Do you have questions or concerns about your Care? No.  Actions: * If pain score is 4 or above: No action needed, pain <4.

## 2014-09-13 ENCOUNTER — Encounter: Payer: Self-pay | Admitting: Internal Medicine

## 2014-10-03 ENCOUNTER — Other Ambulatory Visit: Payer: Self-pay | Admitting: Internal Medicine

## 2014-11-07 ENCOUNTER — Encounter: Payer: 59 | Admitting: Internal Medicine

## 2014-11-21 ENCOUNTER — Encounter: Payer: 59 | Admitting: Internal Medicine

## 2014-12-03 ENCOUNTER — Other Ambulatory Visit: Payer: Self-pay | Admitting: Internal Medicine

## 2014-12-05 ENCOUNTER — Other Ambulatory Visit: Payer: Self-pay | Admitting: Internal Medicine

## 2015-01-26 ENCOUNTER — Other Ambulatory Visit: Payer: Self-pay | Admitting: Internal Medicine

## 2015-01-30 NOTE — Telephone Encounter (Signed)
Patient has missed his last two appointments and hasn't been seen since June 2016. Will refill his medication today but would like to see him in clinic.

## 2015-02-03 ENCOUNTER — Other Ambulatory Visit: Payer: Self-pay | Admitting: Internal Medicine

## 2015-02-05 NOTE — Telephone Encounter (Signed)
Thank you [name]...  

## 2015-02-05 NOTE — Telephone Encounter (Signed)
Pt hasn't been seen since June- will attempt to schedule

## 2015-02-06 ENCOUNTER — Encounter: Payer: Self-pay | Admitting: Internal Medicine

## 2015-03-14 ENCOUNTER — Telehealth: Payer: Self-pay | Admitting: Internal Medicine

## 2015-03-14 NOTE — Telephone Encounter (Signed)
Pt requesting the nurse to call back regarding amlodipine.

## 2015-03-14 NOTE — Telephone Encounter (Signed)
Spoke w/ pt he wants to stop his BP medication due to weight loss in the last week, will make him an appt for mon but will hold til fri pm to do so, he is agreeable

## 2015-03-16 NOTE — Telephone Encounter (Signed)
Amlodipine should not be contributing to weight loss.  It would be best for Robert White to discuss stopping his blood pressure medication with his PCP.  Thanks.

## 2015-03-18 ENCOUNTER — Ambulatory Visit (INDEPENDENT_AMBULATORY_CARE_PROVIDER_SITE_OTHER): Payer: 59 | Admitting: Internal Medicine

## 2015-03-18 ENCOUNTER — Encounter: Payer: Self-pay | Admitting: Internal Medicine

## 2015-03-18 VITALS — BP 133/80 | HR 74 | Temp 98.0°F | Ht 70.0 in | Wt 276.3 lb

## 2015-03-18 DIAGNOSIS — N189 Chronic kidney disease, unspecified: Secondary | ICD-10-CM | POA: Diagnosis not present

## 2015-03-18 DIAGNOSIS — I129 Hypertensive chronic kidney disease with stage 1 through stage 4 chronic kidney disease, or unspecified chronic kidney disease: Secondary | ICD-10-CM | POA: Diagnosis not present

## 2015-03-18 DIAGNOSIS — I10 Essential (primary) hypertension: Secondary | ICD-10-CM

## 2015-03-18 NOTE — Progress Notes (Signed)
Patient ID: Robert White, male   DOB: [birth year], 50 y.o.MRN: [id number]     Subjective:   [name]    DOB: 1965-12-29 50 y.o.    MRN: [id number] Health Maintenance Due: Health Maintenance Due  Topic Date Due  . Hepatitis C Screening  October 10, 1965  . TETANUS/TDAP  01/14/1983  . INFLUENZA VACCINE  08/20/2014    _________________________________________________  HPI: [name] is a 51 y.o. male here to discuss "stopping his blood pressure medication."  PCP is Dr. Charlynn Grimes.  Pt has a PMH outlined below.  Please see problem-based charting assessment and plan for further status of patient's chronic medical problems addressed at today's visit.  PMH: Past Medical History  Diagnosis Date  . Hypertension   . Hyperlipidemia   . Dilated cardiomyopathy ([place])     Hx of-resolves on echo 2008-EF 60-70%  . Nephrolithiasis 2012    Calcium oxalate stones  . Chronic renal insufficiency     Baseline Cr around 1.2, likely secondary to HTN    Medications: Current Outpatient Prescriptions on File Prior to Visit  Medication Sig Dispense Refill  . aspirin 81 MG EC tablet Take 81 mg by mouth daily.      [name] atorvastatin (LIPITOR) 20 MG tablet TAKE 1 TABLET (20 MG TOTAL) BY MOUTH DAILY AT 6 PM. 90 tablet 1  . carvedilol (COREG) 25 MG tablet TAKE 1 TABLET (25 MG TOTAL) BY MOUTH 2 (TWO) TIMES DAILY WITH A MEAL. 180 tablet 3  . enalapril (VASOTEC) 20 MG tablet TAKE 1 TABLET BY MOUTH TWICE A DAY 60 tablet 1  . Multiple Vitamin (MULTIVITAMIN) tablet Take 1 tablet by mouth daily.      . Omega-3 Fatty Acids (FISH OIL) 1200 MG CAPS Take 1 capsule by mouth 2 (two) times daily.      . tadalafil (CIALIS) 10 MG tablet Take 1 tablet (10 mg total) by mouth as needed for erectile dysfunction. (Patient not taking: Reported on 06/20/2014) 10 tablet 6   No current facility-administered medications on file prior to visit.    Allergies: Allergies  Allergen Reactions  . Cephalexin Swelling      FH: Family History  Problem Relation Age of Onset  . Prostate cancer Father   . Colon cancer Neg Hx     SH: Social History   Social History  . Marital Status: Married    Spouse Name: N/A  . Number of Children: N/A  . Years of Education: N/A   Social History Main Topics  . Smoking status: Never Smoker   . Smokeless tobacco: Never Used  . Alcohol Use: No  . Drug Use: No  . Sexual Activity: Not Asked   Other Topics Concern  . None   Social History Narrative   Married, 2 daughters ages 56,20 (2012). Exercises regularly.    Review of Systems: Constitutional: Negative for fever, chills and weight loss.  Eyes: Negative for blurred vision.  Respiratory: Negative for cough and shortness of breath.  Cardiovascular: Negative for chest pain, palpitations and leg swelling.  Gastrointestinal: Negative for nausea, vomiting, abdominal pain, diarrhea, constipation and blood in stool.  Genitourinary: Negative for dysuria, urgency and frequency.  Musculoskeletal: Negative for myalgias and back pain.  Neurological: Negative for dizziness, weakness and headaches.     Objective:   Vital Signs: Filed Vitals:   03/18/15 1533  BP: 133/80  Pulse: 74  Temp: 98 F (36.7 C)  TempSrc: Oral  Height: 5\' 10"  (1.778 m)  Weight: 276 lb 4.8 oz (125.329 kg)  SpO2: 100%      BP Readings from Last 3 Encounters:  03/18/15 133/80  09/10/14 112/70  06/20/14 137/77    Physical Exam: Constitutional: Vital signs reviewed.  Patient is in NAD and cooperative with exam.  Head: Normocephalic and atraumatic. Eyes: EOMI, conjunctivae nl, no scleral icterus.  Neck: Supple. Cardiovascular: RRR, no MRG. Pulmonary/Chest: normal effort, CTAB, no wheezes, rales, or rhonchi. Abdominal: Soft. NT/ND +BS. Neurological: A&O x3, cranial nerves II-XII are grossly intact, moving all extremities. Extremities: 2+DP b/l; no LE edema. Skin: Warm, dry and intact. No rash.   Assessment & Plan:    Assessment and plan was discussed and formulated with my attending.

## 2015-03-18 NOTE — Assessment & Plan Note (Addendum)
A: HTN, pt is here to discuss "stopping blood pressure medications."  This is the first time I am seeing this patient today.  PCP is Dr. Charlynn Grimes.  He is on several anti-hypertensive medications including amlodipine 10mg , enalapril 20mg  bid, and carvedilol 25mg  bid.  He has readings of his blood pressure medications which are in the 0000000 systolic.  He has only been taking 1/2 tab of the amlodipine (5mg ) and wishes to discontinue this.  He has been in a weight loss program ([facility] weight loss) for a couple weeks and is doing well.  Denies ETOH, illicit drug use, or NSAID use.  P: -advised pt it would be OK to d/c amlodipine 5mg  today and f/u in 1 month -advised to follow DASH diet  -he may need to bring in his BP meter to test against our in office -advised to call if his BP>140/90 -unclear why he is taking enalapril bid?  Will discuss this at next OV.

## 2015-03-18 NOTE — Patient Instructions (Addendum)
Thank you for your visit today.   Please return to the internal medicine clinic in 1 month(s) or sooner if needed.     I have made the following additions/changes to your medications: You may stop the amlodipine for now.  Please keep a record of your blood pressure for the next month and call us if it goes above 140/90.    Please be sure to bring all of your medications with you to every visit; this includes herbal supplements, vitamins, eye drops, and any over-the-counter medications.   Should you have any questions regarding your medications and/or any new or worsening symptoms, please be sure to call the clinic at [phone].   If you believe that you are suffering from a life threatening condition or one that may result in the loss of limb or function, then you should call 911 and proceed to the nearest Emergency Department.   DASH Eating Plan DASH stands for "Dietary Approaches to Stop Hypertension." The DASH eating plan is a healthy eating plan that has been shown to reduce high blood pressure (hypertension). Additional health benefits may include reducing the risk of type 2 diabetes mellitus, heart disease, and stroke. The DASH eating plan may also help with weight loss. WHAT DO I NEED TO KNOW ABOUT THE DASH EATING PLAN? For the DASH eating plan, you will follow these general guidelines:  Choose foods with a percent daily value for sodium of less than 5% (as listed on the food label).  Use salt-free seasonings or herbs instead of table salt or sea salt.  Check with your health care provider or pharmacist before using salt substitutes.  Eat lower-sodium products, often labeled as "lower sodium" or "no salt added."  Eat fresh foods.  Eat more vegetables, fruits, and low-fat dairy products.  Choose whole grains. Look for the word "whole" as the first word in the ingredient list.  Choose fish and skinless chicken or [place] more often than red meat. Limit fish, poultry, and meat  to 6 oz (170 g) each day.  Limit sweets, desserts, sugars, and sugary drinks.  Choose heart-healthy fats.  Limit cheese to 1 oz (28 g) per day.  Eat more home-cooked food and less restaurant, buffet, and fast food.  Limit fried foods.  Cook foods using methods other than frying.  Limit canned vegetables. If you do use them, rinse them well to decrease the sodium.  When eating at a restaurant, ask that your food be prepared with less salt, or no salt if possible. WHAT FOODS CAN I EAT? Seek help from a dietitian for individual calorie needs. Grains Whole grain or whole wheat bread. Brown rice. Whole grain or whole wheat pasta. Quinoa, bulgur, and whole grain cereals. Low-sodium cereals. Corn or whole wheat flour tortillas. Whole grain cornbread. Whole grain crackers. Low-sodium crackers. Vegetables Fresh or frozen vegetables (raw, steamed, roasted, or grilled). Low-sodium or reduced-sodium tomato and vegetable juices. Low-sodium or reduced-sodium tomato sauce and paste. Low-sodium or reduced-sodium canned vegetables.  Fruits All fresh, canned (in natural juice), or frozen fruits. Meat and Other Protein Products Ground beef (85% or leaner), grass-fed beef, or beef trimmed of fat. Skinless chicken or [place]. Ground chicken or [place]. Pork trimmed of fat. All fish and seafood. Eggs. Dried beans, peas, or lentils. Unsalted nuts and seeds. Unsalted canned beans. Dairy Low-fat dairy products, such as skim or 1% milk, 2% or reduced-fat cheeses, low-fat ricotta or cottage cheese, or plain low-fat yogurt. Low-sodium or reduced-sodium cheeses. Fats and Oils Tub  margarines without trans fats. Light or reduced-fat mayonnaise and salad dressings (reduced sodium). Avocado. Safflower, olive, or canola oils. Natural peanut or almond butter. Other Unsalted popcorn and pretzels. The items listed above may not be a complete list of recommended foods or beverages. Contact your dietitian for more  options. WHAT FOODS ARE NOT RECOMMENDED? Grains White bread. White pasta. White rice. Refined cornbread. Bagels and croissants. Crackers that contain trans fat. Vegetables Creamed or fried vegetables. Vegetables in a cheese sauce. Regular canned vegetables. Regular canned tomato sauce and paste. Regular tomato and vegetable juices. Fruits Dried fruits. Canned fruit in light or heavy syrup. Fruit juice. Meat and Other Protein Products Fatty cuts of meat. Ribs, chicken wings, bacon, sausage, bologna, salami, chitterlings, fatback, hot dogs, bratwurst, and packaged luncheon meats. Salted nuts and seeds. Canned beans with salt. Dairy Whole or 2% milk, cream, half-and-half, and cream cheese. Whole-fat or sweetened yogurt. Full-fat cheeses or blue cheese. Nondairy creamers and whipped toppings. Processed cheese, cheese spreads, or cheese curds. Condiments Onion and garlic salt, seasoned salt, table salt, and sea salt. Canned and packaged gravies. Worcestershire sauce. Tartar sauce. Barbecue sauce. Teriyaki sauce. Soy sauce, including reduced sodium. Steak sauce. Fish sauce. Oyster sauce. Cocktail sauce. Horseradish. Ketchup and mustard. Meat flavorings and tenderizers. Bouillon cubes. Hot sauce. Tabasco sauce. Marinades. Taco seasonings. Relishes. Fats and Oils Butter, stick margarine, lard, shortening, ghee, and bacon fat. Coconut, palm kernel, or palm oils. Regular salad dressings. Other Pickles and olives. Salted popcorn and pretzels. The items listed above may not be a complete list of foods and beverages to avoid. Contact your dietitian for more information. WHERE CAN I FIND MORE INFORMATION? National Heart, Lung, and Blood Institute: travelstabloid.com   This information is not intended to replace advice given to you by your health care provider. Make sure you discuss any questions you have with your health care provider.   Document Released: 12/25/2010  Document Revised: 01/26/2014 Document Reviewed: 11/09/2012 Elsevier Interactive Patient Education [employer].

## 2015-03-19 NOTE — Assessment & Plan Note (Addendum)
A: CKD P: -repeat SCr at next OV -repeat UA at next OV

## 2015-03-21 NOTE — Progress Notes (Signed)
Internal Medicine Clinic Attending  Case discussed with Dr. Gill soon after the resident saw the patient.  We reviewed the resident's history and exam and pertinent patient test results.  I agree with the assessment, diagnosis, and plan of care documented in the resident's note.  

## 2015-04-02 ENCOUNTER — Other Ambulatory Visit: Payer: Self-pay | Admitting: Internal Medicine

## 2015-05-09 ENCOUNTER — Encounter: Payer: Self-pay | Admitting: Internal Medicine

## 2015-05-09 ENCOUNTER — Ambulatory Visit (INDEPENDENT_AMBULATORY_CARE_PROVIDER_SITE_OTHER): Payer: 59 | Admitting: Internal Medicine

## 2015-05-09 VITALS — BP 125/95 | HR 128 | Temp 98.2°F | Ht 70.0 in | Wt 250.3 lb

## 2015-05-09 DIAGNOSIS — N189 Chronic kidney disease, unspecified: Secondary | ICD-10-CM | POA: Diagnosis not present

## 2015-05-09 DIAGNOSIS — I129 Hypertensive chronic kidney disease with stage 1 through stage 4 chronic kidney disease, or unspecified chronic kidney disease: Secondary | ICD-10-CM | POA: Diagnosis not present

## 2015-05-09 DIAGNOSIS — R000 Tachycardia, unspecified: Secondary | ICD-10-CM | POA: Diagnosis not present

## 2015-05-09 DIAGNOSIS — I10 Essential (primary) hypertension: Secondary | ICD-10-CM

## 2015-05-09 LAB — BASIC METABOLIC PANEL
Anion gap: 10 (ref 5–15)
BUN: 10 mg/dL (ref 6–20)
CHLORIDE: 108 mmol/L (ref 101–111)
CO2: 26 mmol/L (ref 22–32)
Calcium: 9 mg/dL (ref 8.9–10.3)
Creatinine, Ser: 1.32 mg/dL — ABNORMAL HIGH (ref 0.61–1.24)
GFR calc Af Amer: >60 mL/min (ref >60)
GFR calc non Af Amer: >60 mL/min (ref >60)
GLUCOSE: 113 mg/dL — AB (ref 65–99)
POTASSIUM: 3.3 mmol/L — AB (ref 3.5–5.1)
Sodium: 144 mmol/L (ref 135–145)

## 2015-05-10 DIAGNOSIS — R000 Tachycardia, unspecified: Secondary | ICD-10-CM | POA: Insufficient documentation

## 2015-05-10 LAB — CREATININE, URINE, RANDOM: Creatinine, Urine: 250 mg/dL

## 2015-05-10 NOTE — Progress Notes (Signed)
   [name]   DOB: 1965-06-07 50 y.o.   MRN: [id number]  Subjective:   HPI: [name] is a 50 y.o. with PMH of HTN, CKD who presents to [facility] today for evaluation of tachycardia. Over the past couple of months, the patient has been enrolling in a weight loss program that requires him to check his blood pressure and heart rate daily amongst other things including taking magnesium and a coloprep supplement. He also recently took a supplement containing hCG and methyl inosital, but has not taken that for the past couple of weeks and will not continue. On some occasions, he has noted that his heart rate has been in the low 100s, up to about 120. However, he denies any symptoms, including palpitations, chest pain, shortness of breath, lightheadedness, blurred vision, or other symptoms. He is able to tolerate 45 minutes of intensive aerobic exercise without any of the aforementioned symptoms as well. However, this has caused him great worry and he is starting to check his heart rate 10 times a day or more. He does drink at least 120 ounces of water a day and does not feel dehydrated. While he has lost 45 pounds, over the past 2 months, this is intentional. He denies cold or heat intolerance, any trouble swallowing, increased fatigue or irritability, or other symptoms of hyperthyroidism. He has no other complaints at this time and otherwise feels well.   Please see problem-based charting for status of medical issues pertinent to this visit.  Review of Systems: Pertinent items noted in HPI and remainder of comprehensive ROS otherwise negative.  Objective:  Physical Exam: Filed Vitals:   05/09/15 0837  BP: 125/95  Pulse: 128  Temp: 98.2 F (36.8 C)  TempSrc: Oral  Height: 5\' 10"  (1.778 m)  Weight: 250 lb 4.8 oz (113.535 kg)  SpO2: 100%   Gen: Well-appearing, alert and oriented to person, place, and time HEENT: Oropharynx clear without erythema or exudate.  Neck: No  cervical LAD, no thyromegaly or nodules, no JVD noted. CV: Normal rate, regular rhythm, no murmurs, rubs, or gallops Pulmonary: Normal effort, CTA bilaterally, no wheezing, rales, or rhonchi Abdominal: Soft, non-tender, non-distended, without rebound, guarding, or masses Extremities: Distal pulses 2+ in upper and lower extremities bilaterally, no tenderness, erythema or edema Skin: No atypical appearing moles. No rashes  Assessment & Plan:  Please see problem-based charting for assessment and plan.  Blane Ohara, MD Resident Physician, PGY-1 Department of Internal Medicine [facility]

## 2015-05-10 NOTE — Assessment & Plan Note (Signed)
Patient denies any symptoms during episodes of tachycardia. These seem to be anxiety driven, as he is now checking his heart rate many times per day. He says that his heart rate decreases significantly when he is relaxed, talking to friends. I manually rechecked his heart rate after we talked for several minutes, and was 78. He denies any symptoms of hyperthyroidism, so I would hold off on a TSH for now. Less likely but still a possibility is from dehydration, but the patient does not appear so on exam and he only has a very slight increase in his creatinine from previous readings. -I reassured the patient that in the absence of symptoms this is likely a benign process. I encouraged the patient only to check his vitals once per day. -I did instruct the patient to call us if he starts developing any symptoms as discussed, such as shortness of breath, palpitations, chest pain, lightheadedness, or other similar symptoms. -Follow-up at next clinic visit

## 2015-05-10 NOTE — Assessment & Plan Note (Signed)
His baseline creatinine is roughly 1.2 likely secondary to hypertensive nephropathy. Given his recent significant weight loss as well as palpitations, I have a slight suspicion for dehydration as well. He is due for a creatinine recheck regardless. BMP today shows slight elevation in creatinine to 1.3, from 1.2 at last visit. -Counseled patient on adequate by mouth hydration, and to decrease the amount of coloprep he is taking through the weight loss clinic, which may be contributing -Continue control of hypertension -Check BMP at next visit

## 2015-05-10 NOTE — Assessment & Plan Note (Signed)
BP Readings from Last 3 Encounters:  05/09/15 125/95  03/18/15 133/80  09/10/14 112/70    Lab Results  Component Value Date   NA 144 05/09/2015   K 3.3* 05/09/2015   CREATININE 1.32* 05/09/2015    Assessment: Blood pressure control:  at goal Progress toward BP goal:   at goal Comments: On Enalapril 20 mg twice a day and carvedilol 25 mg twice a day, also with significant weight loss over the past couple of months  Plan: Medications:  Continue current medications Other plans: Discuss possibility of decreasing enalapril to once daily and next visit.

## 2015-05-13 ENCOUNTER — Telehealth: Payer: Self-pay

## 2015-05-13 NOTE — Telephone Encounter (Signed)
Please call pt back.

## 2015-05-13 NOTE — Telephone Encounter (Signed)
Pt calls and states his HR is still continuing to be over 100 even at rest, he denies chest pain, shortness of breath but is very anxious about his HR being so high. He would like to know the results of his labs especially the TSH but the TSH was cancelled He does not want to continue seeing different doctors because"everyone has a different opinion" appt offered for 5/3 dr Merrilyn Puma- refused, he wants to be seen this week, 4/26 dr gill

## 2015-05-13 NOTE — Telephone Encounter (Signed)
Agree with appointment. I have not met this patient yet. Per chart review it appears there is a significant component of anxiety that may be contributing. If he develops shortness of breath, palpitations, chest pain to go to the ED for further evaluation.

## 2015-05-15 ENCOUNTER — Ambulatory Visit (INDEPENDENT_AMBULATORY_CARE_PROVIDER_SITE_OTHER): Payer: 59 | Admitting: Internal Medicine

## 2015-05-15 ENCOUNTER — Encounter: Payer: Self-pay | Admitting: Internal Medicine

## 2015-05-15 VITALS — BP 128/98 | HR 126 | Temp 98.0°F | Ht 70.0 in | Wt 250.3 lb

## 2015-05-15 DIAGNOSIS — R000 Tachycardia, unspecified: Secondary | ICD-10-CM

## 2015-05-15 DIAGNOSIS — I10 Essential (primary) hypertension: Secondary | ICD-10-CM

## 2015-05-15 MED ORDER — ENALAPRIL MALEATE 20 MG PO TABS: 20.0000 mg | ORAL_TABLET | Freq: Every day | ORAL | Status: DC

## 2015-05-15 NOTE — Assessment & Plan Note (Addendum)
BP well controlled.   -will decrease enalapril to once daily (will call him and let him know)

## 2015-05-15 NOTE — Assessment & Plan Note (Addendum)
Pt has experienced tachycardia since about 2 weeks ago.  States he can feel his heart racing especially when he lies down.  No chest pain or shortness of breath.  He has been taking several OTC supplements including magnesium, b6, and formula 1 intestinal.  Denies blood loss.  Has been drinking a lot of water.  Has been going to the bathroom to defecate several times daily 4-5 times daily.  Has also experienced some symptoms of hyperthyroidism including anxiety, diarrhea, weight loss.  Denies any tremors.  Denies caffeine use and has been drinking a lot of water.  However, I feel his tachycardia may be due to overuse of the colon cleansing medication and we discussed stopping this supplement.  -check TSH, cbc, UA  -would discontinue use of OTC supplements

## 2015-05-15 NOTE — Progress Notes (Signed)
Case discussed with Dr. Gordy Levan soon after the resident saw the patient. We reviewed the resident's history and exam and pertinent patient test results. I agree with the assessment, diagnosis, and plan of care documented in the resident's note.  I agree with her plan to discontinue the colon cleansing agents as this is the likely cause of his symptoms, although checking for evidence of hyperthyroidism is also very prudent given the constellation of the symptoms.  We will not lower the dose of the enalapril to QD.  This is a BID medication and his diastolic blood pressure was elevated today at 98.

## 2015-05-15 NOTE — Progress Notes (Signed)
Patient ID: Robert White, male   DOB: 08/13/1965, 50 y.o.   MRN: [id number]     Subjective:   [name]    DOB: [birth year] 50 y.o.    MRN: [id number] Health Maintenance Due: Health Maintenance Due  Topic Date Due  . Hepatitis C Screening  07-18-63  . TETANUS/TDAP  01/14/1983    _________________________________________________  HPI: [name] is a 50 y.o. male here for a follow up visit for tachycardia.  Pt has a PMH outlined below.  Please see problem-based charting assessment and plan for further status of patient's chronic medical problems addressed at today's visit.  PMH: Past Medical History  Diagnosis Date  . Hypertension   . Hyperlipidemia   . Dilated cardiomyopathy ([place])     Hx of-resolves on echo 2008-EF 60-70%  . Nephrolithiasis 2012    Calcium oxalate stones  . Chronic renal insufficiency     Baseline Cr around 1.2, likely secondary to HTN    Medications: Current Outpatient Prescriptions on File Prior to Visit  Medication Sig Dispense Refill  . aspirin 81 MG EC tablet Take 81 mg by mouth daily.      [name] atorvastatin (LIPITOR) 20 MG tablet TAKE 1 TABLET (20 MG TOTAL) BY MOUTH DAILY AT 6 PM. 90 tablet 1  . carvedilol (COREG) 25 MG tablet TAKE 1 TABLET (25 MG TOTAL) BY MOUTH 2 (TWO) TIMES DAILY WITH A MEAL. 180 tablet 3  . enalapril (VASOTEC) 20 MG tablet TAKE 1 TABLET BY MOUTH TWICE A DAY 60 tablet 1  . Multiple Vitamin (MULTIVITAMIN) tablet Take 1 tablet by mouth daily.      . Omega-3 Fatty Acids (FISH OIL) 1200 MG CAPS Take 1 capsule by mouth 2 (two) times daily.      . tadalafil (CIALIS) 10 MG tablet Take 1 tablet (10 mg total) by mouth as needed for erectile dysfunction. (Patient not taking: Reported on 06/20/2014) 10 tablet 6   No current facility-administered medications on file prior to visit.    Allergies: Allergies  Allergen Reactions  . Cephalexin Swelling    FH: Family History  Problem Relation Age of Onset  .  Prostate cancer Father   . Colon cancer Neg Hx     SH: Social History   Social History  . Marital Status: Married    Spouse Name: N/A  . Number of Children: N/A  . Years of Education: N/A   Social History Main Topics  . Smoking status: Never Smoker   . Smokeless tobacco: Never Used  . Alcohol Use: No  . Drug Use: No  . Sexual Activity: Not Asked   Other Topics Concern  . None   Social History Narrative   Married, 2 daughters ages 57,20 (2012). Exercises regularly.    Review of Systems: Constitutional: Negative for fever, chills.  Eyes: Negative for blurred vision.  Respiratory: Negative for cough and shortness of breath.  Cardiovascular: Negative for chest pain, +palpitations.  Gastrointestinal: Negative for nausea, vomiting. Neurological: Negative for dizziness.   Objective:   Vital Signs: Filed Vitals:   05/15/15 0857  BP: 128/98  Pulse: 126  Temp: 98 F (36.7 C)  TempSrc: Oral  Height: 5\' 10"  (1.778 m)  Weight: 250 lb 4.8 oz (113.535 kg)  SpO2: 100%      BP Readings from Last 3 Encounters:  05/15/15 128/98  05/09/15 125/95  03/18/15 133/80    Physical Exam: Constitutional: Vital signs reviewed.  Patient is in NAD and  cooperative with exam.  Head: Normocephalic and atraumatic. Eyes: EOMI, conjunctivae nl, no scleral icterus.  Neck: Supple. Cardiovascular: RRR, no MRG. Pulmonary/Chest: normal effort, CTAB, no wheezes, rales, or rhonchi. Neurological: A&O x3, cranial nerves II-XII are grossly intact, moving all extremities. Extremities: No LE edema. Skin: Warm, dry and intact. No rash.   Assessment & Plan:   Assessment and plan was discussed and formulated with my attending.

## 2015-05-15 NOTE — Patient Instructions (Signed)
Thank you for your visit today.   Please return to the internal medicine clinic in 1 month(s) or sooner if needed.     I have made the following additions/changes to your medications:  I would stop the formula 1 intestine.  I would also stop the magnesium.   It is OK for your to continue with the B6. We will check your labs today.   Please be sure to bring all of your medications with you to every visit; this includes herbal supplements, vitamins, eye drops, and any over-the-counter medications.   Should you have any questions regarding your medications and/or any new or worsening symptoms, please be sure to call the clinic at [phone].   If you believe that you are suffering from a life threatening condition or one that may result in the loss of limb or function, then you should call 911 and proceed to the nearest Emergency Department.   A healthy lifestyle and preventative care can promote health and wellness.   Maintain regular health, dental, and eye exams.  Eat a healthy diet. Foods like vegetables, fruits, whole grains, low-fat dairy products, and lean protein foods contain the nutrients you need without too many calories. Decrease your intake of foods high in solid fats, added sugars, and salt. Get information about a proper diet from your caregiver, if necessary.  Regular physical exercise is one of the most important things you can do for your health. Most adults should get at least 150 minutes of moderate-intensity exercise (any activity that increases your heart rate and causes you to sweat) each week. In addition, most adults need muscle-strengthening exercises on 2 or more days a week.   Maintain a healthy weight. The body mass index (BMI) is a screening tool to identify possible weight problems. It provides an estimate of body fat based on height and weight. Your caregiver can help determine your BMI, and can help you achieve or maintain a healthy weight. For adults 20  years and older:  A BMI below 18.5 is considered underweight.  A BMI of 18.5 to 24.9 is normal.  A BMI of 25 to 29.9 is considered overweight.  A BMI of 30 and above is considered obese.

## 2015-05-16 ENCOUNTER — Encounter: Payer: Self-pay | Admitting: Internal Medicine

## 2015-05-16 LAB — BMP8+ANION GAP
ANION GAP: 17 mmol/L (ref 10.0–18.0)
BUN/Creatinine Ratio: 11 (ref 9–20)
BUN: 13 mg/dL (ref 6–24)
CALCIUM: 9.2 mg/dL (ref 8.7–10.2)
CHLORIDE: 105 mmol/L (ref 96–106)
CO2: 22 mmol/L (ref 18–29)
Creatinine, Ser: 1.21 mg/dL (ref 0.76–1.27)
GFR calc non Af Amer: 69 mL/min/{1.73_m2} (ref >59)
GFR, EST AFRICAN AMERICAN: 80 mL/min/{1.73_m2} (ref >59)
GLUCOSE: 107 mg/dL — AB (ref 65–99)
POTASSIUM: 4.2 mmol/L (ref 3.5–5.2)
Sodium: 144 mmol/L (ref 134–144)

## 2015-05-16 LAB — MICROSCOPIC EXAMINATION
Bacteria, UA: NONE SEEN
Casts: NONE SEEN /lpf

## 2015-05-16 LAB — URINALYSIS, COMPLETE
BILIRUBIN UA: NEGATIVE
GLUCOSE, UA: NEGATIVE
KETONES UA: NEGATIVE
LEUKOCYTES UA: NEGATIVE
NITRITE UA: NEGATIVE
PH UA: 6.5 (ref 5.0–7.5)
RBC, UA: NEGATIVE
Specific Gravity, UA: 1.019 (ref 1.005–1.030)
UUROB: 0.2 mg/dL (ref 0.2–1.0)

## 2015-05-16 LAB — CBC
HEMATOCRIT: 48.3 % (ref 37.5–51.0)
Hemoglobin: 15.6 g/dL (ref 12.6–17.7)
MCH: 29.5 pg (ref 26.6–33.0)
MCHC: 32.3 g/dL (ref 31.5–35.7)
MCV: 92 fL (ref 79–97)
PLATELETS: 339 10*3/uL (ref 150–379)
RBC: 5.28 x10E6/uL (ref 4.14–5.80)
RDW: 14.6 % (ref 12.3–15.4)
WBC: 7.3 10*3/uL (ref 3.4–10.8)

## 2015-05-16 LAB — TSH: TSH: 0.588 u[IU]/mL (ref 0.450–4.500)

## 2015-05-20 ENCOUNTER — Encounter: Payer: Self-pay | Admitting: Internal Medicine

## 2015-05-20 NOTE — Progress Notes (Signed)
Medicine attending: Medical history, presenting problems, physical findings, and medications, reviewed with resident physician Dr William Kennedy on the day of the patient visit and I concur with his evaluation and management plan. 

## 2015-05-23 ENCOUNTER — Ambulatory Visit (INDEPENDENT_AMBULATORY_CARE_PROVIDER_SITE_OTHER): Payer: 59 | Admitting: Cardiovascular Disease

## 2015-05-23 ENCOUNTER — Encounter: Payer: Self-pay | Admitting: Cardiovascular Disease

## 2015-05-23 VITALS — BP 144/96 | HR 125 | Ht 70.0 in | Wt 248.2 lb

## 2015-05-23 DIAGNOSIS — I10 Essential (primary) hypertension: Secondary | ICD-10-CM

## 2015-05-23 DIAGNOSIS — I4892 Unspecified atrial flutter: Secondary | ICD-10-CM

## 2015-05-23 DIAGNOSIS — E785 Hyperlipidemia, unspecified: Secondary | ICD-10-CM

## 2015-05-23 MED ORDER — DILTIAZEM HCL ER COATED BEADS 180 MG PO CP24: 180.0000 mg | ORAL_CAPSULE | Freq: Every day | ORAL | Status: DC

## 2015-05-23 MED ORDER — RIVAROXABAN 20 MG PO TABS: 20.0000 mg | ORAL_TABLET | Freq: Every day | ORAL | Status: DC

## 2015-05-23 NOTE — Patient Instructions (Addendum)
Medication Instructions:  Your physician has recommended you make the following change in your medication:  1) STOP Aspirin 2) STOP Norvasc 3) START Cardizem 180 CD by mouth ONCE daily 4) START Xarelto 20 mg by mouth ONCE daily - with dinner  Labwork: Your physician recommends that you return for lab work in: in one week FASTING (TSH, T4, BMET, Lipid/Liver) The lab can be found on the FIRST FLOOR of out building in [address]   Testing/Procedures: Your physician has requested that you have an echocardiogram. Echocardiography is a painless test that uses sound waves to create images of your heart. It provides your doctor with information about the size and shape of your heart and how well your heart's chambers and valves are working. This procedure takes approximately one hour. There are no restrictions for this procedure.    Follow-Up: Your physician recommends that you schedule a follow-up appointment in: [facility]  Your physician recommends that you schedule a follow-up appointment in: 3 weeks with Dr. Gwenlyn Found     Any Other Special Instructions Will Be Listed Below (If Applicable).     If you need a refill on your cardiac medications before your next appointment, please call your pharmacy.

## 2015-05-23 NOTE — Assessment & Plan Note (Signed)
History of hypertension blood pressure measured today 144/96. He is on carvedilol, enalapril and amlodipine.Given his rapid ventricular response on going to change his amlodipine to Cardizem CD 180 mg.

## 2015-05-23 NOTE — Progress Notes (Signed)
05/23/2015 Robert White   March 02, 1965  [id number]  Primary Physician Maryellen Pile, MD Primary Cardiologist: Lorretta Harp MD Renae Gloss   HPI:  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  He has a history of hypertension hyperlipidemia. He has never had a heart attack or stroke. He denies chest pain or shortness of breath. Over the last several weeks she's noticed tachypalpitations and was evaluated by the internal medicine service at [facility] although EKG was not performed.his lab work is remarkable for a low TSH at the lower limit of normal. Today in the office he has a flutter with a heart rate of 125 and 21 conduction. He is on a beta blocker.   Current Outpatient Prescriptions  Medication Sig Dispense Refill  . atorvastatin (LIPITOR) 20 MG tablet TAKE 1 TABLET (20 MG TOTAL) BY MOUTH DAILY AT 6 PM. 90 tablet 1  . carvedilol (COREG) 25 MG tablet TAKE 1 TABLET (25 MG TOTAL) BY MOUTH 2 (TWO) TIMES DAILY WITH A MEAL. 180 tablet 3  . enalapril (VASOTEC) 20 MG tablet Take 1 tablet (20 mg total) by mouth daily. 30 tablet 1  . Multiple Vitamin (MULTIVITAMIN) tablet Take 1 tablet by mouth daily.      . Omega-3 Fatty Acids (FISH OIL) 1200 MG CAPS Take 1 capsule by mouth 2 (two) times daily.      [name] diltiazem (CARDIZEM CD) 180 MG 24 hr capsule Take 1 capsule (180 mg total) by mouth daily. 90 capsule 3  . rivaroxaban (XARELTO) 20 MG TABS tablet Take 1 tablet (20 mg total) by mouth daily with supper. 30 tablet 3   No current facility-administered medications for this visit.    Allergies  Allergen Reactions  . Cephalexin Swelling  . Penicillins Swelling    Unsure of reaction    Social History   Social History  . Marital Status: Married    Spouse Name: Robert White  . Number of Children: Robert White  . Years of Education: Robert White   Occupational History  . Not on file.     Social History Main Topics  . Smoking status: Never Smoker   . Smokeless tobacco: Never Used  . Alcohol Use: No  . Drug Use: No  . Sexual Activity: Not on file   Other Topics Concern  . Not on file   Social History Narrative   Married, 2 daughters ages 48,20 (2012). Exercises regularly.     Review of Systems: General: negative for chills, fever, night sweats or weight changes.  Cardiovascular: negative for chest pain, dyspnea on exertion, edema, orthopnea, palpitations, paroxysmal nocturnal dyspnea or shortness of breath Dermatological: negative for rash Respiratory: negative for cough or wheezing Urologic: negative for hematuria Abdominal: negative for nausea, vomiting, diarrhea, bright red blood per rectum, melena, or hematemesis Neurologic: negative for visual changes, syncope, or dizziness All other systems reviewed and are otherwise negative except as noted above.    Blood pressure 144/96, pulse 125, height 5\' 10"  (1.778 m), weight 248 lb 4 oz (112.605 kg).  General appearance: alert and no distress Neck: no adenopathy, no carotid bruit, no JVD, supple, symmetrical, trachea midline and thyroid not enlarged, symmetric, no tenderness/mass/nodules Lungs: clear to auscultation bilaterally Heart: regular rate and rhythm, S1, S2 normal, no murmur, click, rub or gallop Extremities: extremities normal, atraumatic, no cyanosis or edema  EKG a flutter with a ventricular response of 125 (  21 conduction). I briefly reviewed this EKG  ASSESSMENT AND PLAN:   Essential hypertension History of hypertension blood pressure measured today 144/96. He is on carvedilol, enalapril and amlodipine.Given his rapid ventricular response on going to change his amlodipine to Cardizem CD 180 mg.  HYPERLIPIDEMIA istory of hyperlipidemia on statin therapy followed by his PCP  Atrial flutter ([place]) Patient has had rapid heart rate for a couple weeks now. He was evaluated at the [facility] outpatient  clinic for tachycardia although an EKG was not performed His TSH is at the lower limit of normal.The CHA2DSVASC2 score is  1 . I'm going to change his amlodipine to diltiazem CD 180 mg a day, start Xarelto  oral coagulation because of his elevated risk of stroke. We'll check a 2-D echocardiogram as well as routine lab work.I will have him seen back in one to 2 weeks by our Pharm D for evaluation of rate response and blood pressure control. He will most likely ultimately require outpatient DC cardioversion.      Lorretta Harp MD FACP,FACC,FAHA, [facility] 05/23/2015 5:27 PM

## 2015-05-23 NOTE — Assessment & Plan Note (Signed)
Patient has had rapid heart rate for a couple weeks now. He was evaluated at the [facility] outpatient clinic for tachycardia although an EKG was not performed His TSH is at the lower limit of normal.The CHA2DSVASC2 score is  1 . I'm going to change his amlodipine to diltiazem CD 180 mg a day, start Xarelto  oral coagulation because of his elevated risk of stroke. We'll check a 2-D echocardiogram as well as routine lab work.I will have him seen back in one to 2 weeks by our Pharm D for evaluation of rate response and blood pressure control. He will most likely ultimately require outpatient DC cardioversion.

## 2015-05-23 NOTE — Assessment & Plan Note (Signed)
istory of hyperlipidemia on statin therapy followed by his PCP 

## 2015-05-30 ENCOUNTER — Ambulatory Visit (INDEPENDENT_AMBULATORY_CARE_PROVIDER_SITE_OTHER): Payer: 59 | Admitting: Pharmacist Clinician (PhC)/ Clinical Pharmacy Specialist

## 2015-05-30 ENCOUNTER — Encounter: Payer: 59 | Admitting: Pharmacist Clinician (PhC)/ Clinical Pharmacy Specialist

## 2015-05-30 ENCOUNTER — Other Ambulatory Visit: Payer: Self-pay | Admitting: *Deleted

## 2015-05-30 ENCOUNTER — Telehealth: Payer: Self-pay | Admitting: Pharmacist Clinician (PhC)/ Clinical Pharmacy Specialist

## 2015-05-30 VITALS — BP 128/90 | HR 126 | Ht 70.0 in | Wt 246.2 lb

## 2015-05-30 DIAGNOSIS — R000 Tachycardia, unspecified: Secondary | ICD-10-CM | POA: Diagnosis not present

## 2015-05-30 MED ORDER — DILTIAZEM HCL ER COATED BEADS 240 MG PO CP24: 240.0000 mg | ORAL_CAPSULE | Freq: Every day | ORAL | Status: DC

## 2015-05-30 NOTE — Progress Notes (Signed)
     05/30/2015 Robert White [birth year] [id number]   HPI:  Robert White is a 50 y.o. male patient of Dr Gwenlyn Found, with a PMH below who presents today for hypertension clinic evaluation.  He has had problems with elevated heart rate recently, so Dr. Gwenlyn Found switched him from amlodipine to diltiazem 180 mg daily.  He unfortunately mis-understood the directions given to him and continued with amlodipine, but stopped the enalapril instead.  He continues to have an elevated heart rate, running consistently from 120-130 bpm.    Cardiac Hx: atrial flutter, hypertension, hyperlipidemia  Family Hx: father died at 34, had TIA, kidney failure;   Social Hx: no caffeine, alcohol, tobacco  Diet: does eat out occasionally, prefers grilled or steamed foods; no added salt at home  Exercise: was doing spin class and elliptical 3-4 times per week but holding off until HR better controlled  Home BP readings: 110-127/75-82   Current antihypertensive medications: diltiazem 180 mg, amlodipine 10 mg   Wt Readings from Last 3 Encounters:  05/30/15 246 lb 3.2 oz (111.676 kg)  05/23/15 248 lb 4 oz (112.605 kg)  05/15/15 250 lb 4.8 oz (113.535 kg)   BP Readings from Last 3 Encounters:  05/30/15 128/90  05/23/15 144/96  05/15/15 128/98   Pulse Readings from Last 3 Encounters:  05/30/15 126  05/23/15 125  05/15/15 126    Current Outpatient Prescriptions  Medication Sig Dispense Refill  . atorvastatin (LIPITOR) 20 MG tablet TAKE 1 TABLET (20 MG TOTAL) BY MOUTH DAILY AT 6 PM. 90 tablet 1  . carvedilol (COREG) 25 MG tablet TAKE 1 TABLET (25 MG TOTAL) BY MOUTH 2 (TWO) TIMES DAILY WITH A MEAL. 180 tablet 3  . diltiazem (CARDIZEM CD) 180 MG 24 hr capsule Take 1 capsule (180 mg total) by mouth daily. 90 capsule 3  . enalapril (VASOTEC) 20 MG tablet Take 1 tablet (20 mg total) by mouth daily. 30 tablet 1  . Multiple Vitamin (MULTIVITAMIN) tablet Take 1 tablet by mouth daily.      . Omega-3 Fatty Acids (FISH  OIL) 1200 MG CAPS Take 1 capsule by mouth 2 (two) times daily.      . rivaroxaban (XARELTO) 20 MG TABS tablet Take 1 tablet (20 mg total) by mouth daily with supper. 30 tablet 3   No current facility-administered medications for this visit.    Allergies  Allergen Reactions  . Cephalexin Swelling  . Penicillins Swelling    Unsure of reaction    Past Medical History  Diagnosis Date  . Hypertension   . Hyperlipidemia   . Dilated cardiomyopathy ([place])     Hx of-resolves on echo 2008-EF 60-70%  . Nephrolithiasis 2012    Calcium oxalate stones  . Chronic renal insufficiency     Baseline Cr around 1.2, likely secondary to HTN  . Atrial flutter ([facility])     Blood pressure 128/90, pulse 126, height 5\' 10"  (1.778 m), weight 246 lb 3.2 oz (111.676 kg).    Tommy Medal PharmD CPP [facility]

## 2015-05-30 NOTE — Assessment & Plan Note (Signed)
HR still elevated, unchanged since last visit on May 4.  Will increase diltiazem to 240 mg today, have him stop the amlodipine and restart the enalapril 20 mg once daily.  He has a follow up appointment with Dr. Gwenlyn Found in 3 weeks, but can call if HR does not come down with increased dose.

## 2015-05-30 NOTE — Telephone Encounter (Signed)
Close encounter 

## 2015-05-30 NOTE — Patient Instructions (Signed)
Return for a a follow up appointment with Dr. Gwenlyn Found  Your blood pressure today is 128/90  Check your blood pressure at home daily and keep record of the readings.  Take your BP meds as follows: restart the enalapril at 20 mg each morning; increase diltiazem to 240 mg; STOP AMLODIPINE  Bring all of your meds, your BP cuff and your record of home blood pressures to your next appointment.  Exercise as you're able, try to walk approximately 30 minutes per day.  Keep salt intake to a minimum, especially watch canned and prepared boxed foods.  Eat more fresh fruits and vegetables and fewer canned items.  Avoid eating in fast food restaurants.    HOW TO TAKE YOUR BLOOD PRESSURE: . Rest 5 minutes before taking your blood pressure. .  Don't smoke or drink caffeinated beverages for at least 30 minutes before. . Take your blood pressure before (not after) you eat. . Sit comfortably with your back supported and both feet on the floor (don't cross your legs). . Elevate your arm to heart level on a table or a desk. . Use the proper sized cuff. It should fit smoothly and snugly around your bare upper arm. There should be enough room to slip a fingertip under the cuff. The bottom edge of the cuff should be 1 inch above the crease of the elbow. . Ideally, take 3 measurements at one sitting and record the average.

## 2015-05-31 ENCOUNTER — Other Ambulatory Visit: Payer: Self-pay | Admitting: Internal Medicine

## 2015-06-06 ENCOUNTER — Ambulatory Visit (HOSPITAL_COMMUNITY): Payer: 59 | Attending: Cardiology

## 2015-06-06 ENCOUNTER — Other Ambulatory Visit: Payer: Self-pay

## 2015-06-06 DIAGNOSIS — E785 Hyperlipidemia, unspecified: Secondary | ICD-10-CM

## 2015-06-06 DIAGNOSIS — I4892 Unspecified atrial flutter: Secondary | ICD-10-CM

## 2015-06-06 DIAGNOSIS — I10 Essential (primary) hypertension: Secondary | ICD-10-CM | POA: Insufficient documentation

## 2015-06-06 DIAGNOSIS — I340 Nonrheumatic mitral (valve) insufficiency: Secondary | ICD-10-CM | POA: Insufficient documentation

## 2015-06-10 ENCOUNTER — Telehealth: Payer: Self-pay | Admitting: Cardiovascular Disease

## 2015-06-10 NOTE — Telephone Encounter (Signed)
Pt would like his echo results from Thursday please.

## 2015-06-11 NOTE — Telephone Encounter (Signed)
Pt made aware of results no questions at this time.   

## 2015-06-18 ENCOUNTER — Ambulatory Visit (INDEPENDENT_AMBULATORY_CARE_PROVIDER_SITE_OTHER): Payer: 59 | Admitting: Cardiovascular Disease

## 2015-06-18 ENCOUNTER — Other Ambulatory Visit: Payer: Self-pay | Admitting: *Deleted

## 2015-06-18 ENCOUNTER — Encounter: Payer: Self-pay | Admitting: Cardiovascular Disease

## 2015-06-18 VITALS — BP 152/92 | HR 67 | Ht 70.0 in | Wt 240.2 lb

## 2015-06-18 DIAGNOSIS — I10 Essential (primary) hypertension: Secondary | ICD-10-CM

## 2015-06-18 DIAGNOSIS — I4891 Unspecified atrial fibrillation: Secondary | ICD-10-CM

## 2015-06-18 DIAGNOSIS — Z79899 Other long term (current) drug therapy: Secondary | ICD-10-CM

## 2015-06-18 DIAGNOSIS — Z0000 Encounter for general adult medical examination without abnormal findings: Secondary | ICD-10-CM

## 2015-06-18 DIAGNOSIS — Z01818 Encounter for other preprocedural examination: Secondary | ICD-10-CM | POA: Diagnosis not present

## 2015-06-18 LAB — BASIC METABOLIC PANEL
BUN: 14 mg/dL (ref 7–25)
CALCIUM: 9.4 mg/dL (ref 8.6–10.3)
CO2: 27 mmol/L (ref 20–31)
CREATININE: 1.24 mg/dL (ref 0.70–1.33)
Chloride: 104 mmol/L (ref 98–110)
GLUCOSE: 96 mg/dL (ref 65–99)
Potassium: 4.2 mmol/L (ref 3.5–5.3)
SODIUM: 140 mmol/L (ref 135–146)

## 2015-06-18 LAB — T4, FREE: FREE T4: 1.6 ng/dL (ref 0.8–1.8)

## 2015-06-18 LAB — CBC WITH DIFFERENTIAL/PLATELET
BASOS ABS: 0 {cells}/uL (ref 0–200)
Basophils Relative: 0 %
EOS ABS: 79 {cells}/uL (ref 15–500)
Eosinophils Relative: 1 %
HEMATOCRIT: 50.5 % — AB (ref 38.5–50.0)
HEMOGLOBIN: 16.3 g/dL (ref 13.2–17.1)
LYMPHS ABS: 1896 {cells}/uL (ref 850–3900)
Lymphocytes Relative: 24 %
MCH: 29.2 pg (ref 27.0–33.0)
MCHC: 32.3 g/dL (ref 32.0–36.0)
MCV: 90.5 fL (ref 80.0–100.0)
MONO ABS: 711 {cells}/uL (ref 200–950)
MPV: 10.6 fL (ref 7.5–12.5)
Monocytes Relative: 9 %
NEUTROS ABS: 5214 {cells}/uL (ref 1500–7800)
NEUTROS PCT: 66 %
Platelets: 340 10*3/uL (ref 140–400)
RBC: 5.58 MIL/uL (ref 4.20–5.80)
RDW: 13.6 % (ref 11.0–15.0)
WBC: 7.9 10*3/uL (ref 3.8–10.8)

## 2015-06-18 LAB — T3, FREE: T3 FREE: 3 pg/mL (ref 2.3–4.2)

## 2015-06-18 MED ORDER — LOSARTAN POTASSIUM 100 MG PO TABS: 100.0000 mg | ORAL_TABLET | Freq: Every day | ORAL | Status: DC

## 2015-06-18 NOTE — Assessment & Plan Note (Signed)
Robert White is being seen back today for atrial flutter. Today he is in A. Fib with a heart rate of 107. He is on carvedilol and diltiazem.I started him on Xarelto on 05/23/15 which she has taken daily since. A 2-D echocardiogram revealed an EF in the 25-30% range with a mildly dilated LV as well as mild concentric LVH. Compared to his prior echo which showed an EF of 70 this for represents a significant decline which I suspect is related to his arrhythmia. I am going to arrange to perform outpatient DC cardioversion on him next week.

## 2015-06-18 NOTE — Progress Notes (Signed)
06/18/2015 [name]   [birth year]  [id number]  Primary Physician Maryellen Pile, MD Primary Cardiologist: Lorretta Harp MD Renae Gloss   HPI:  Mr. 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 I last saw him in the office 05/23/15. He has a history of hypertension hyperlipidemia. He has never had a heart attack or stroke. He denies chest pain or shortness of breath. Over the last several weeks she's noticed tachypalpitations and was evaluated by the internal medicine service at [facility] although EKG was not performed.his lab work is remarkable for a low TSH at the lower limit of normal. Today in the office he has a flutter with a heart rate of 125 and 2:1 conduction. He is on a beta blocker.I added Cardizem CD 240 mg and today in the office he is in atrial fibrillation with a ventricular response of 107. A 2-D echo showed a decline in his ejection fraction from the 70% range down to 25-30%.   Current Outpatient Prescriptions  Medication Sig Dispense Refill  . atorvastatin (LIPITOR) 20 MG tablet TAKE 1 TABLET (20 MG TOTAL) BY MOUTH DAILY AT 6 PM. 90 tablet 1  . carvedilol (COREG) 25 MG tablet TAKE 1 TABLET (25 MG TOTAL) BY MOUTH 2 (TWO) TIMES DAILY WITH A MEAL. 180 tablet 3  . diltiazem (CARDIZEM CD) 240 MG 24 hr capsule Take 1 capsule (240 mg total) by mouth daily. 30 capsule 3  . Multiple Vitamin (MULTIVITAMIN) tablet Take 1 tablet by mouth daily.      . Omega-3 Fatty Acids (FISH OIL) 1200 MG CAPS Take 1 capsule by mouth 2 (two) times daily.      . rivaroxaban (XARELTO) 20 MG TABS tablet Take 1 tablet (20 mg total) by mouth daily with supper. 30 tablet 3   No current facility-administered medications for this visit.    Allergies  Allergen Reactions  . Cephalexin Swelling  . Penicillins Swelling    Unsure of reaction    Social History   Social History   . Marital Status: Married    Spouse Name: N/A  . Number of Children: N/A  . Years of Education: N/A   Occupational History  . Not on file.   Social History Main Topics  . Smoking status: Never Smoker   . Smokeless tobacco: Never Used  . Alcohol Use: No  . Drug Use: No  . Sexual Activity: Not on file   Other Topics Concern  . Not on file   Social History Narrative   Married, 2 daughters ages 46,20 (2012). Exercises regularly.     Review of Systems: General: negative for chills, fever, night sweats or weight changes.  Cardiovascular: negative for chest pain, dyspnea on exertion, edema, orthopnea, palpitations, paroxysmal nocturnal dyspnea or shortness of breath Dermatological: negative for rash Respiratory: negative for cough or wheezing Urologic: negative for hematuria Abdominal: negative for nausea, vomiting, diarrhea, bright red blood per rectum, melena, or hematemesis Neurologic: negative for visual changes, syncope, or dizziness All other systems reviewed and are otherwise negative except as noted above.    Blood pressure 152/92, pulse 67, height 5\' 10"  (1.778 m), weight 240 lb 3.2 oz (108.954 kg), SpO2 97 %.  General appearance: alert and no distress Neck: no adenopathy, no carotid bruit, no JVD, supple, symmetrical, trachea midline and thyroid not enlarged, symmetric, no tenderness/mass/nodules Lungs: clear to auscultation bilaterally Heart: irregularly irregular rhythm  Extremities: extremities normal, atraumatic, no cyanosis or edema  EKG H the fibrillation with a ventricular response of 107 and nonspecific ST and T-wave changes. I personally reviewed this EKG  ASSESSMENT AND PLAN:   Essential hypertension History of hypertension with blood pressure measured today at 152/92. Review of his blood pressure log at home reveals diastolics in the 0000000 as well. I'm going to change his Vasotec to losartan 100 mg a day.  Atrial flutter [facility]) Mr. Quiroz is being seen  back today for atrial flutter. Today he is in A. Fib with a heart rate of 107. He is on carvedilol and diltiazem.I started him on Xarelto on 05/23/15 which she has taken daily since. A 2-D echocardiogram revealed an EF in the 25-30% range with a mildly dilated LV as well as mild concentric LVH. Compared to his prior echo which showed an EF of 70 this for represents a significant decline which I suspect is related to his arrhythmia. I am going to arrange to perform outpatient DC cardioversion on him next week.      Lorretta Harp MD FACP,FACC,FAHA, [facility] 06/18/2015 11:55 AM

## 2015-06-18 NOTE — Patient Instructions (Signed)
Medication Instructions:   Your physician has recommended you make the following change in your medication:  1- STOP TAKING YOUR ENALAPRIL(VASOTEC) 2- START losartan 100 mg (1 tablet) by mouth daily.    Labwork: Your physician recommends that you return for lab work TODAY. The lab can be found on the FIRST FLOOR of out building in [address]    Testing/Procedures: Your physician has recommended that you have a Cardioversion (DCCV). Electrical Cardioversion uses a jolt of electricity to your heart either through paddles or wired patches attached to your chest. This is a controlled, usually prescheduled, procedure. Defibrillation is done under light anesthesia in the hospital, and you usually go home the day of the procedure. This is done to get your heart back into a normal rhythm. You are not awake for the procedure. Please see the instruction sheet given to you today.  NEXT Monday OR THRUSDAY    Any Other Special Instructions Will Be Listed Below (If Applicable).     If you need a refill on your cardiac medications before your next appointment, please call your pharmacy.

## 2015-06-18 NOTE — Assessment & Plan Note (Signed)
History of hypertension with blood pressure measured today at 152/92. Review of his blood pressure log at home reveals diastolics in the 0000000 as well. I'm going to change his Vasotec to losartan 100 mg a day.

## 2015-06-19 ENCOUNTER — Telehealth: Payer: Self-pay | Admitting: Cardiovascular Disease

## 2015-06-19 NOTE — Telephone Encounter (Signed)
Pt has blood in his urine,he is on Xarelto. Please call asap to advise.

## 2015-06-19 NOTE — Telephone Encounter (Signed)
Pt of Dr. Gwenlyn Found. Seen yesterday. He is on Xarelto, has pending DCCV for atrial flutter.  Returned patient call. He states he got up to urinate last night around 3am, had pink tinged urine. Dark red urine this morning just before he called.  Pt very concerned, asking for instruction on what to do.  He has seen a urologist in the past, but this was several years ago for a kidney stone. PCP is [facility]. I advised not to interrupt xarelto at this time - will seek advice from physician.  Pt wanted to know if the newly prescribed Losartan he started yesterday could have had a causative effect for the bleed. Informed him I do not know this to be the case.

## 2015-06-19 NOTE — Telephone Encounter (Signed)
Patient states blood in urine last night then again this am.  Cleared up for much of the day, but came back late this afternoon.   Scheduled for DCCV next Monday, so was told not to stop Xarelto.    Suggested he go to urgent care clinic for urinalysis either this evening or tomorrow morning.

## 2015-06-20 ENCOUNTER — Telehealth: Payer: Self-pay | Admitting: Pharmacist Clinician (PhC)/ Clinical Pharmacy Specialist

## 2015-06-20 NOTE — Telephone Encounter (Signed)
Patient called yesterday with hematuria x 24 hrs, notes that he was started on Xarelto last week with plans for DCCV next week.  Had advised that he go to UC yesterday or this am.  He went last night and was told that CBC was WNL and no signs of infection.  Bleeding most likely result of starting Xarelto.    Today he reports still slight tinge of blood, he is trying to stay well hydrated.    Reviewed with Dr. Debara Pickett - advised patient d/c xarelto for now.  Need to determine cause of hematuria before we can continue.  Advised patient to see PCP or urologist for exam to determine cause of hematuria.  Will have Dr. Kennon Holter RN cancel Monday DCCV and will reschedule once we determine what is going on.

## 2015-06-24 ENCOUNTER — Ambulatory Visit (HOSPITAL_COMMUNITY): Admission: RE | Admit: 2015-06-24 | Payer: 59 | Source: Ambulatory Visit | Admitting: Cardiology

## 2015-06-24 ENCOUNTER — Encounter (HOSPITAL_COMMUNITY): Admission: RE | Payer: Self-pay | Source: Ambulatory Visit

## 2015-06-24 SURGERY — CARDIOVERSION
Anesthesia: Monitor Anesthesia Care

## 2015-06-24 MED ORDER — APIXABAN 5 MG PO TABS: 5.0000 mg | ORAL_TABLET | Freq: Two times a day (BID) | ORAL | Status: DC

## 2015-06-24 NOTE — Telephone Encounter (Signed)
Patient called today stating that he stopped the Xarelto over the weekend and has not had any incidence of hematuria since stopping. He feels it was the medication that caused him to bleed and sees no need in going to a urologist. He wishes to stay off the medication. Instructed him that we would like for him to be on an anticoagulant due to his afib. Offered for him to switch to do a trial on Eliquis or warfarin. He prefferred to try Eliquis. He requested copay card and samples. Will leave both for him at the front desk and send prescription to pharmacy of choice.

## 2015-06-26 ENCOUNTER — Telehealth: Payer: Self-pay | Admitting: *Deleted

## 2015-06-26 NOTE — Telephone Encounter (Signed)
Notes Recorded by Fidel Levy, RN on 06/26/2015 at 10:09 AM Patient called w/lab results. Voiced understanding.   He reports his BP has been more elevated since stopping enalapril 20mg  QD and starting losartan. He would prefer to take enalapril BID He also was changed from xarelto to eliquis 5mg  BID - he started this on Monday - he wanted to know when he can have a cardioversion. Notes Recorded by Lorretta Harp, MD on 06/24/2015 at 7:11 PM Call and tell normal

## 2015-06-26 NOTE — Telephone Encounter (Signed)
Spoke with Dr Gwenlyn Found concerning cardioversion. He stated to have pt scheduled for DCCV.  Called pt and let him know I am in the process of scheduling the cardioversion.  Patient stated he preferred to be scheduled the first of next week. I told patient I will call him back with the details as soon as the procedure is scheduled. He verbalized understanding.

## 2015-06-27 ENCOUNTER — Other Ambulatory Visit: Payer: Self-pay | Admitting: *Deleted

## 2015-06-27 ENCOUNTER — Telehealth: Payer: Self-pay

## 2015-06-27 NOTE — Telephone Encounter (Addendum)
[name] is a 50 y.o. male who was contacted via telephone for monitoring of apixaban (Eliquis) therapy.    ASSESSMENT Indication(s): atrial fibrillation Duration: indefinite  Labs:    Component Value Date/Time   AST 19 07/13/2012 0903   ALT 23 07/13/2012 0903   NA 140 06/18/2015 1246   NA 144 05/15/2015 1004   K 4.2 06/18/2015 1246   CL 104 06/18/2015 1246   CO2 27 06/18/2015 1246   GLUCOSE 96 06/18/2015 1246   GLUCOSE 107* 05/15/2015 1004   BUN 14 06/18/2015 1246   BUN 13 05/15/2015 1004   CREATININE 1.24 06/18/2015 1246   CREATININE 1.21 05/15/2015 1004   CALCIUM 9.4 06/18/2015 1246   GFRNONAA 69 05/15/2015 1004   GFRNONAA 71 06/20/2014 1443   GFRAA 80 05/15/2015 1004   GFRAA 82 06/20/2014 1443   WBC 7.9 06/18/2015 1246   WBC 7.3 05/15/2015 1004   HGB 16.3 06/18/2015 1246   HCT 50.5* 06/18/2015 1246   HCT 48.3 05/15/2015 1004   PLT 340 06/18/2015 1246   PLT 339 05/15/2015 1004    apixaban (Eliquis) Dose: 5 mg BID  Safety: Patient had no recent bleeding/thromboembolic events. Patient reports no recent signs or symptoms of bleeding, no signs of symptoms of thromboembolism. Medication changes: no.    Adherence: Patient has no known adherence challenges. Patient does correctly recite the dose. Contacted pharmacy and records indicate refills are consistent. He was switched from Xarelto to Eliquis this past month so hard to observe good adherence.   Patient Instructions: Patient advised to contact clinic or seek medical attention if signs/symptoms of bleeding or thromboembolism occur. Patient verbalized understanding by repeating back information.  Follow-up Recommended labs to consider: LFTs . Next appointment   Angelena Form PharmD Candidate  06/27/2015, 2:43 PM

## 2015-06-27 NOTE — Telephone Encounter (Signed)
Called Central Scheduling and DCCV scheduled for June 12th, 2017 @ 1000 with Dr [name]. Case # [id number].

## 2015-06-27 NOTE — Telephone Encounter (Signed)
Called patient and gave him date and time of cardioversion.  Instructed patient that procedure will be 07/01/15 @ 1000am and that he will need to arrive at 8:30 a.m. At the main entrance "A" at [place] patient to follow the instructions as follows:  1. DIET: Nothing to eat or drink after midnight except your medications with a sip of water.  2. Have your bloodwork drawn at [facility] building ([place]) or at the [place] lab downstairs on 06/18/15. (Blood work already drawn and within 14 days of procedure.)  3. MAKE SURE YOU TAKE YOUR Eliquis  4. YOU MAY TAKE ALL of your remaining medications with a small amount of water.  5. You must have a responsible person to drive you home.  6. Bring a current list of your medications and your current insurance cards.  Patient verbalized understanding.

## 2015-06-28 ENCOUNTER — Other Ambulatory Visit: Payer: Self-pay | Admitting: Internal Medicine

## 2015-07-01 ENCOUNTER — Ambulatory Visit (HOSPITAL_COMMUNITY): Payer: 59 | Admitting: Anesthesiology

## 2015-07-01 ENCOUNTER — Ambulatory Visit (HOSPITAL_COMMUNITY)
Admission: RE | Admit: 2015-07-01 | Discharge: 2015-07-01 | Disposition: A | Discharge status: Home / Self Care | Payer: 59 | Source: Ambulatory Visit | Attending: Cardiovascular Disease | Admitting: Cardiovascular Disease

## 2015-07-01 ENCOUNTER — Ambulatory Visit (HOSPITAL_COMMUNITY): Payer: 59

## 2015-07-01 ENCOUNTER — Ambulatory Visit (HOSPITAL_BASED_OUTPATIENT_CLINIC_OR_DEPARTMENT_OTHER): Payer: 59

## 2015-07-01 ENCOUNTER — Encounter (HOSPITAL_COMMUNITY): Payer: Self-pay

## 2015-07-01 ENCOUNTER — Encounter (HOSPITAL_COMMUNITY)
Admission: RE | Disposition: A | Discharge status: Home / Self Care | Payer: Self-pay | Source: Ambulatory Visit | Attending: Cardiovascular Disease

## 2015-07-01 DIAGNOSIS — Z79899 Other long term (current) drug therapy: Secondary | ICD-10-CM | POA: Diagnosis not present

## 2015-07-01 DIAGNOSIS — I10 Essential (primary) hypertension: Secondary | ICD-10-CM | POA: Diagnosis not present

## 2015-07-01 DIAGNOSIS — I4819 Other persistent atrial fibrillation: Secondary | ICD-10-CM | POA: Insufficient documentation

## 2015-07-01 DIAGNOSIS — Z7901 Long term (current) use of anticoagulants: Secondary | ICD-10-CM | POA: Diagnosis not present

## 2015-07-01 DIAGNOSIS — I4892 Unspecified atrial flutter: Secondary | ICD-10-CM | POA: Insufficient documentation

## 2015-07-01 DIAGNOSIS — I4891 Unspecified atrial fibrillation: Secondary | ICD-10-CM | POA: Diagnosis not present

## 2015-07-01 DIAGNOSIS — Z6834 Body mass index (BMI) 34.0-34.9, adult: Secondary | ICD-10-CM | POA: Diagnosis not present

## 2015-07-01 DIAGNOSIS — I481 Persistent atrial fibrillation: Secondary | ICD-10-CM

## 2015-07-01 DIAGNOSIS — E663 Overweight: Secondary | ICD-10-CM | POA: Diagnosis not present

## 2015-07-01 HISTORY — PX: CARDIOVERSION: SHX1299

## 2015-07-01 HISTORY — PX: TEE WITHOUT CARDIOVERSION: SHX5443

## 2015-07-01 SURGERY — CARDIOVERSION
Anesthesia: General

## 2015-07-01 MED ORDER — BUTAMBEN-TETRACAINE-BENZOCAINE 2-2-14 % EX AERO
INHALATION_SPRAY | CUTANEOUS | Status: DC | PRN
  Administered 2015-07-01: 2 via TOPICAL

## 2015-07-01 MED ORDER — LIDOCAINE HCL (CARDIAC) 20 MG/ML IV SOLN
INTRAVENOUS | Status: DC | PRN
  Administered 2015-07-01 (×3): 20 mg via INTRATRACHEAL

## 2015-07-01 MED ORDER — PROPOFOL 500 MG/50ML IV EMUL
INTRAVENOUS | Status: DC | PRN
  Administered 2015-07-01: 100 ug/kg/min via INTRAVENOUS

## 2015-07-01 MED ORDER — SODIUM CHLORIDE 0.9 % IV SOLN
INTRAVENOUS | Status: DC
  Administered 2015-07-01: 10:00:00 via INTRAVENOUS

## 2015-07-01 NOTE — H&P (View-Only) (Signed)
06/18/2015 [name]   23-Jul-1965  [id number]  Primary Physician Maryellen Pile, MD Primary Cardiologist: Lorretta Harp MD Renae Gloss   HPI:  Mr. 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 I last saw him in the office 05/23/15. He has a history of hypertension hyperlipidemia. He has never had a heart attack or stroke. He denies chest pain or shortness of breath. Over the last several weeks she's noticed tachypalpitations and was evaluated by the internal medicine service at [facility] although EKG was not performed.his lab work is remarkable for a low TSH at the lower limit of normal. Today in the office he has a flutter with a heart rate of 125 and 2:1 conduction. He is on a beta blocker.I added Cardizem CD 240 mg and today in the office he is in atrial fibrillation with a ventricular response of 107. A 2-D echo showed a decline in his ejection fraction from the 70% range down to 25-30%.   Current Outpatient Prescriptions  Medication Sig Dispense Refill  . atorvastatin (LIPITOR) 20 MG tablet TAKE 1 TABLET (20 MG TOTAL) BY MOUTH DAILY AT 6 PM. 90 tablet 1  . carvedilol (COREG) 25 MG tablet TAKE 1 TABLET (25 MG TOTAL) BY MOUTH 2 (TWO) TIMES DAILY WITH A MEAL. 180 tablet 3  . diltiazem (CARDIZEM CD) 240 MG 24 hr capsule Take 1 capsule (240 mg total) by mouth daily. 30 capsule 3  . Multiple Vitamin (MULTIVITAMIN) tablet Take 1 tablet by mouth daily.      . Omega-3 Fatty Acids (FISH OIL) 1200 MG CAPS Take 1 capsule by mouth 2 (two) times daily.      . rivaroxaban (XARELTO) 20 MG TABS tablet Take 1 tablet (20 mg total) by mouth daily with supper. 30 tablet 3   No current facility-administered medications for this visit.    Allergies  Allergen Reactions  . Cephalexin Swelling  . Penicillins Swelling    Unsure of reaction    Social History   Social History   . Marital Status: Married    Spouse Name: N/A  . Number of Children: N/A  . Years of Education: N/A   Occupational History  . Not on file.   Social History Main Topics  . Smoking status: Never Smoker   . Smokeless tobacco: Never Used  . Alcohol Use: No  . Drug Use: No  . Sexual Activity: Not on file   Other Topics Concern  . Not on file   Social History Narrative   Married, 2 daughters ages 19,20 (2012). Exercises regularly.     Review of Systems: General: negative for chills, fever, night sweats or weight changes.  Cardiovascular: negative for chest pain, dyspnea on exertion, edema, orthopnea, palpitations, paroxysmal nocturnal dyspnea or shortness of breath Dermatological: negative for rash Respiratory: negative for cough or wheezing Urologic: negative for hematuria Abdominal: negative for nausea, vomiting, diarrhea, bright red blood per rectum, melena, or hematemesis Neurologic: negative for visual changes, syncope, or dizziness All other systems reviewed and are otherwise negative except as noted above.    Blood pressure 152/92, pulse 67, height 5\' 10"  (1.778 m), weight 240 lb 3.2 oz (108.954 kg), SpO2 97 %.  General appearance: alert and no distress Neck: no adenopathy, no carotid bruit, no JVD, supple, symmetrical, trachea midline and thyroid not enlarged, symmetric, no tenderness/mass/nodules Lungs: clear to auscultation bilaterally Heart: irregularly irregular rhythm  Extremities: extremities normal, atraumatic, no cyanosis or edema  EKG H the fibrillation with a ventricular response of 107 and nonspecific ST and T-wave changes. I personally reviewed this EKG  ASSESSMENT AND PLAN:   Essential hypertension History of hypertension with blood pressure measured today at 152/92. Review of his blood pressure log at home reveals diastolics in the 0000000 as well. I'm going to change his Vasotec to losartan 100 mg a day.  Atrial flutter [facility]) Mr. Malvin is being seen  back today for atrial flutter. Today he is in A. Fib with a heart rate of 107. He is on carvedilol and diltiazem.I started him on Xarelto on 05/23/15 which she has taken daily since. A 2-D echocardiogram revealed an EF in the 25-30% range with a mildly dilated LV as well as mild concentric LVH. Compared to his prior echo which showed an EF of 70 this for represents a significant decline which I suspect is related to his arrhythmia. I am going to arrange to perform outpatient DC cardioversion on him next week.      Lorretta Harp MD FACP,FACC,FAHA, [facility] 06/18/2015 11:55 AM

## 2015-07-01 NOTE — Discharge Instructions (Signed)
Transesophageal Echocardiogram Transesophageal echocardiography (TEE) is a picture test of your heart using sound waves. The pictures taken can give very detailed pictures of your heart. This can help your doctor see if there are problems with your heart. TEE can check:  If your heart has blood clots in it.  How well your heart valves are working.  If you have an infection on the inside of your heart.  Some of the major arteries of your heart.  If your heart valve is working after a Office manager.  Your heart before a procedure that uses a shock to your heart to get the rhythm back to normal. BEFORE THE PROCEDURE  Do not eat or drink for 6 hours before the procedure or as told by your doctor.  Make plans to have someone drive you home after the procedure. Do not drive yourself home.  An IV tube will be put in your arm. PROCEDURE  You will be given a medicine to help you relax (sedative). It will be given through the IV tube.  A numbing medicine will be sprayed or gargled in the back of your throat to help numb it.  The tip of the probe is placed into the back of your mouth. You will be asked to swallow. This helps to pass the probe into your esophagus.  Once the tip of the probe is in the right place, your doctor can take pictures of your heart.  You may feel pressure at the back of your throat. AFTER THE PROCEDURE  You will be taken to a recovery area so the sedative can wear off.  Your throat may be sore and scratchy. This will go away slowly over time.  You will go home when you are fully awake and able to swallow liquids.  You should have someone stay with you for the next 24 hours.  Do not drive or operate machinery for the next 24 hours.   This information is not intended to replace advice given to you by your health care provider. Make sure you discuss any questions you have with your health care provider.   Document Released: 11/02/2008 Document Revised: 01/10/2013  Document Reviewed: 07/07/2012 Elsevier Interactive Patient Education 2016 [employer].   Electrical Cardioversion, Care After Refer to this sheet in the next few weeks. These instructions provide you with information on caring for yourself after your procedure. Your health care provider may also give you more specific instructions. Your treatment has been planned according to current medical practices, but problems sometimes occur. Call your health care provider if you have any problems or questions after your procedure. WHAT TO EXPECT AFTER THE PROCEDURE After your procedure, it is typical to have the following sensations:  Some redness on the skin where the shocks were delivered. If this is tender, a sunburn lotion or hydrocortisone cream may help.  Possible return of an abnormal heart rhythm within hours or days after the procedure. HOME CARE INSTRUCTIONS  Take medicines only as directed by your health care provider. Be sure you understand how and when to take your medicine.  Learn how to feel your pulse and check it often.  Limit your activity for 48 hours after the procedure or as directed by your health care provider.  Avoid or minimize caffeine and other stimulants as directed by your health care provider. SEEK MEDICAL CARE IF:  You feel like your heart is beating too fast or your pulse is not regular.  You have any questions about your  medicines.  You have bleeding that will not stop. SEEK IMMEDIATE MEDICAL CARE IF:  You are dizzy or feel faint.  It is hard to breathe or you feel short of breath.  There is a change in discomfort in your chest.  Your speech is slurred or you have trouble moving an arm or leg on one side of your body.  You get a serious muscle cramp that does not go away.  Your fingers or toes turn cold or blue.   This information is not intended to replace advice given to you by your health care provider. Make sure you discuss any questions you have  with your health care provider.   Document Released: 10/26/2012 Document Revised: 01/26/2014 Document Reviewed: 10/26/2012 Elsevier Interactive Patient Education [employer].

## 2015-07-01 NOTE — Progress Notes (Signed)
  Echocardiogram Echocardiogram Transesophageal has been performed.  Joelene Millin 07/01/2015, 10:55 AM

## 2015-07-01 NOTE — Op Note (Signed)
INDICATIONS: atrial fibrillation Interruption in precardioversion anticoagulation  PROCEDURE:   Informed consent was obtained prior to the procedure. The risks, benefits and alternatives for the procedure were discussed and the patient comprehended these risks.  Risks include, but are not limited to, cough, sore throat, vomiting, nausea, somnolence, esophageal and stomach trauma or perforation, bleeding, low blood pressure, aspiration, pneumonia, infection, trauma to the teeth and death.    After a procedural time-out, the oropharynx was anesthetized with 20% benzocaine spray.   During this procedure the patient was administered IV propofol by Anesthesiology (Dr. Deatra Canter) to achieve and maintain moderate conscious sedation.  The patient's heart rate, blood pressure, and oxygen saturation were monitored continuously during the procedure.  The transesophageal probe was inserted in the esophagus and stomach without difficulty and multiple views were obtained.  The patient was kept under observation until the patient left the procedure room.  The patient left the procedure room in stable condition.   Agitated microbubble saline contrast was not administered.  COMPLICATIONS:    There were no immediate complications.  FINDINGS:  No LA thrombus.  RECOMMENDATIONS:   Proceed with cardioversion  Time Spent Directly with the Patient:  45 minutes   Robert White 07/01/2015, 10:32 AM

## 2015-07-01 NOTE — Anesthesia Postprocedure Evaluation (Signed)
Anesthesia Post Note  Patient: Robert White  Procedure(s) Performed: Procedure(s) (LRB): CARDIOVERSION (N/A) TRANSESOPHAGEAL ECHOCARDIOGRAM (TEE) (N/A)  Patient location during evaluation: PACU Anesthesia Type: MAC Level of consciousness: awake and alert Pain management: pain level controlled Vital Signs Assessment: post-procedure vital signs reviewed and stable Respiratory status: spontaneous breathing, nonlabored ventilation, respiratory function stable and patient connected to nasal cannula oxygen Cardiovascular status: stable and blood pressure returned to baseline Anesthetic complications: no    Last Vitals:  Filed Vitals:   07/01/15 1100 07/01/15 1110  BP: 135/93 125/90  Pulse: 77 76  Temp:    Resp: 20 17    Last Pain: There were no vitals filed for this visit.               Tiajuana Amass

## 2015-07-01 NOTE — Anesthesia Preprocedure Evaluation (Signed)
Anesthesia Evaluation  Patient identified by MRN, date of birth, ID band Patient awake    Reviewed: Allergy & Precautions, NPO status , Patient's Chart, lab work & pertinent test results, reviewed documented beta blocker date and time   Airway Mallampati: II   Neck ROM: Full    Dental  (+) Dental Advisory Given   Pulmonary neg pulmonary ROS,    breath sounds clear to auscultation       Cardiovascular hypertension, Pt. on medications and Pt. on home beta blockers + dysrhythmias Atrial Fibrillation  Rhythm:Irregular Rate:Tachycardia     Neuro/Psych negative neurological ROS     GI/Hepatic negative GI ROS, Neg liver ROS,   Endo/Other  negative endocrine ROS  Renal/GU CRFRenal disease     Musculoskeletal   Abdominal   Peds  Hematology negative hematology ROS (+)   Anesthesia Other Findings   Reproductive/Obstetrics                             Anesthesia Physical Anesthesia Plan  ASA: III  Anesthesia Plan: General   Post-op Pain Management:    Induction: Intravenous  Airway Management Planned: Mask and Natural Airway  Additional Equipment:   Intra-op Plan:   Post-operative Plan:   Informed Consent: I have reviewed the patients History and Physical, chart, labs and discussed the procedure including the risks, benefits and alternatives for the proposed anesthesia with the patient or authorized representative who has indicated his/her understanding and acceptance.     Plan Discussed with: CRNA  Anesthesia Plan Comments:         Anesthesia Quick Evaluation

## 2015-07-01 NOTE — Interval H&P Note (Signed)
History and Physical Interval Note:  07/01/2015 8:19 AM  Robert White  has presented today for surgery, with the diagnosis of A FIB   The various methods of treatment have been discussed with the patient and family. After consideration of risks, benefits and other options for treatment, the patient has consented to  Procedure(s): CARDIOVERSION (N/A) as a surgical intervention .  The patient's history has been reviewed, patient examined, no change in status, stable for surgery.  I have reviewed the patient's chart and labs.  Questions were answered to the patient's satisfaction.     [name]

## 2015-07-01 NOTE — Op Note (Signed)
Procedure: Electrical Cardioversion Indications:  Atrial Fibrillation  Procedure Details:  Consent: Risks of procedure as well as the alternatives and risks of each were explained to the (patient/caregiver).  Consent for procedure obtained.  Time Out: Verified patient identification, verified procedure, site/side was marked, verified correct patient position, special equipment/implants available, medications/allergies/relevent history reviewed, required imaging and test results available.  Performed  Patient placed on cardiac monitor, pulse oximetry, supplemental oxygen as necessary.  Sedation given: IV propofol Pacer pads placed anterior and posterior chest.  Cardioverted 1 time(s).  Cardioversion with synchronized biphasic 120J shock.  Evaluation: Findings: Post procedure EKG shows: NSR Complications: None Patient did tolerate procedure well.  Time Spent Directly with the Patient:  30 minutes   Malikiah Debarr 07/01/2015, 10:35 AM

## 2015-07-01 NOTE — Transfer of Care (Signed)
Immediate Anesthesia Transfer of Care Note  Patient: Robert White  Procedure(s) Performed: Procedure(s): CARDIOVERSION (N/A) TRANSESOPHAGEAL ECHOCARDIOGRAM (TEE) (N/A)  Patient Location: PACU and Endoscopy Unit  Anesthesia Type:MAC  Level of Consciousness: awake, oriented, patient cooperative and responds to stimulation  Airway & Oxygen Therapy: Patient Spontanous Breathing and Patient connected to nasal cannula oxygen  Post-op Assessment: Report given to RN and Post -op Vital signs reviewed and stable  Post vital signs: Reviewed and stable  Last Vitals:  Filed Vitals:   07/01/15 0826  BP: 165/97  Pulse: 119  Temp: 36.8 C  Resp: 23    Last Pain: There were no vitals filed for this visit.       Complications: No apparent anesthesia complications

## 2015-07-02 ENCOUNTER — Encounter (HOSPITAL_COMMUNITY): Payer: Self-pay | Admitting: Cardiovascular Disease

## 2015-07-10 ENCOUNTER — Ambulatory Visit: Payer: 59 | Admitting: Pharmacist

## 2015-07-10 ENCOUNTER — Ambulatory Visit (INDEPENDENT_AMBULATORY_CARE_PROVIDER_SITE_OTHER): Payer: 59 | Admitting: Internal Medicine

## 2015-07-10 VITALS — BP 130/80 | HR 65

## 2015-07-10 DIAGNOSIS — I4892 Unspecified atrial flutter: Secondary | ICD-10-CM

## 2015-07-10 DIAGNOSIS — Z7901 Long term (current) use of anticoagulants: Secondary | ICD-10-CM

## 2015-07-10 DIAGNOSIS — Z23 Encounter for immunization: Secondary | ICD-10-CM

## 2015-07-10 DIAGNOSIS — Z79899 Other long term (current) drug therapy: Secondary | ICD-10-CM

## 2015-07-10 DIAGNOSIS — I10 Essential (primary) hypertension: Secondary | ICD-10-CM

## 2015-07-10 DIAGNOSIS — Z0000 Encounter for general adult medical examination without abnormal findings: Secondary | ICD-10-CM

## 2015-07-10 MED ORDER — ENALAPRIL MALEATE 20 MG PO TABS: 20.0000 mg | ORAL_TABLET | Freq: Every day | ORAL | Status: DC

## 2015-07-10 MED ORDER — ATORVASTATIN CALCIUM 20 MG PO TABS: ORAL_TABLET | ORAL | Status: DC

## 2015-07-10 NOTE — Progress Notes (Signed)
Patient ID: Robert White, male   DOB: 11-19-1965, 50 y.o.   MRN: [id number] Patient ID: Robert White, male   DOB: 26-Feb-1965, 50 y.o.   MRN: [id number]     Subjective:   [name]    DOB: November 05, 1965 50 y.o.    MRN: [id number] Health Maintenance Due: Health Maintenance Due  Topic Date Due  . Hepatitis C Screening  09-08-1965  . TETANUS/TDAP  01/14/1983   _________________________________________________  HPI: Robert White is a 50 y.o. male here for a follow up visit for HTN and A. Flutter.  Pt has a PMH outlined below.  Please see problem-based charting assessment and plan for further status of patient's chronic medical problems addressed at today's visit.  PMH: Past Medical History  Diagnosis Date  . Hypertension   . Hyperlipidemia   . Dilated cardiomyopathy ([place])     Hx of-resolves on echo 2008-EF 60-70%  . Nephrolithiasis 2012    Calcium oxalate stones  . Chronic renal insufficiency     Baseline Cr around 1.2, likely secondary to HTN  . Atrial flutter ([place])   . Atrial fibrillation, persistent ([place])   . Left ventricular dysfunction     ejection fraction 25-30% by 2-D echo    Medications: Current Outpatient Prescriptions on File Prior to Visit  Medication Sig Dispense Refill  . apixaban (ELIQUIS) 5 MG TABS tablet Take 1 tablet (5 mg total) by mouth 2 (two) times daily. 60 tablet 2  . carvedilol (COREG) 25 MG tablet TAKE 1 TABLET (25 MG TOTAL) BY MOUTH 2 (TWO) TIMES DAILY WITH A MEAL. 180 tablet 3  . diltiazem (CARDIZEM CD) 240 MG 24 hr capsule Take 1 capsule (240 mg total) by mouth daily. 30 capsule 3  . OVER THE COUNTER MEDICATION Place 1 drop into both eyes daily as needed (dry eyes). Over the counter lubricating eye drop    . Pyridoxine HCl (VITAMIN B-6 PO) Take 1 tablet by mouth daily.     No current facility-administered medications on file prior to visit.    Allergies: Allergies  Allergen Reactions  . Cephalexin Swelling  .  Penicillins Swelling    Lip swelling Has patient had a PCN reaction causing immediate rash, facial/tongue/throat swelling, SOB or lightheadedness with hypotension: Yes Has patient had a PCN reaction causing severe rash involving mucus membranes or skin necrosis: No Has patient had a PCN reaction that required hospitalization No Has patient had a PCN reaction occurring within the last 10 years: Yes If all of the above answers are "NO", then may proceed with Cephalosporin use.    FH: Family History  Problem Relation Age of Onset  . Prostate cancer Father   . Colon cancer Neg Hx     SH: Social History   Social History  . Marital Status: Married    Spouse Name: N/A  . Number of Children: N/A  . Years of Education: N/A   Social History Main Topics  . Smoking status: Never Smoker   . Smokeless tobacco: Never Used  . Alcohol Use: No  . Drug Use: No  . Sexual Activity: Not on file   Other Topics Concern  . Not on file   Social History Narrative   Married, 2 daughters ages 76,20 (2012). Exercises regularly.    Review of Systems: Constitutional: Negative for fever, chills.  Eyes: Negative for blurred vision.  Respiratory: Negative for cough and shortness of breath.  Cardiovascular: Negative for chest pain, palpitations.  Gastrointestinal:  Negative for nausea, vomiting. Neurological: Negative for dizziness.   Objective:   Vital Signs: Filed Vitals:   07/10/15 1500  BP: 130/80  Pulse: 65      BP Readings from Last 3 Encounters:  07/10/15 130/80  07/01/15 125/90  06/18/15 152/92    Physical Exam: Constitutional: Vital signs reviewed.  Patient is in NAD and cooperative with exam.  Head: Normocephalic and atraumatic. Eyes: EOMI, conjunctivae nl, no scleral icterus.  Neck: Supple. Cardiovascular: Irregular rate and rhythm, no MRG. Pulmonary/Chest: normal effort, CTAB, no wheezes, rales, or rhonchi. Neurological: A&O x3, cranial nerves II-XII are grossly intact,  moving all extremities. Extremities: No LE edema. Skin: Warm, dry and intact. No rash.   Assessment & Plan:   Case discussed with Dr. Lynnae January. Please refer to Problem based charting for further details of today's visit.

## 2015-07-10 NOTE — Patient Instructions (Signed)
Thank you for coming in today.   Your heart is back in the A.Fib/Flutter today. Your heart rate is doing great and everything looks okay. Your blood pressure was 130/80 today. You can go back to taking the enalapril 20 mg twice a day and stop the losartan.  Continue the rest of your medications as before.   I would like to see you back in 1 month for follow up of your blood pressure.

## 2015-07-10 NOTE — Assessment & Plan Note (Signed)
Cardioverted on 6/12. Had TEE done at that time which showed an EF of 40-45%, improved from prior 2D ECHO on 06/06/15 with EF 25-30%. Also with mildly dilated LV and mild concentric LVH. Suspect this is related to his arrhthymia and will improve once controlled.  Today, he reports he has not had any issues with tachycardia or palpitations since being cardioverted. He appears to be back in A. Fib on exam today. Rated well controlled in the 60s. Denies any symptoms. No shortness of breath. TSH, free T4, T3 were wnl at last check.   He is currently on Eliquis 5 mg bid (had hematuria with Xarelto), Diltiazem 240 mg CD, Coreg 25 mg bid and losartan 100 mg daily. Reports not liking the losartan and preferring the enalapril he was taking previously. BP 130/80 today. CHAD-VASc score is 1.   Plan Will continue Eliquis (seeing Dr. Maudie Mercury today), Dilt and Coreg.  Will D/C losartan and restart Enalapril 20 mg bid

## 2015-07-10 NOTE — Assessment & Plan Note (Signed)
Needs Hep C screening - will get at next lab draw.   Received Tdap today.

## 2015-07-10 NOTE — Assessment & Plan Note (Signed)
BP Readings from Last 3 Encounters:  07/10/15 130/80  07/01/15 125/90  06/18/15 152/92    Lab Results  Component Value Date   NA 140 06/18/2015   K 4.2 06/18/2015   CREATININE 1.24 06/18/2015    Assessment: BP initially 154/92 on arrival but improved to 130/80 on re-check. Denies any headaches, dizziness, vision changes. Checks his blood pressure frequently at home (too much according to wife) and reports it runs 130/90s (no log today). Denies any symptoms when it runs high. Currently on Coreg 25 mg bid, Cardizem CD 240 mg daily and Losartan 100 mg daily. Reports he does not like how the losartan makes him feel and would prefer to go back to the Enalapril he was on previously.   Plan: Will d/c losartan and restart Enalapril 20 mg bid.  Bring BP log to next visit Return in 1 month for BP re-check

## 2015-07-10 NOTE — Progress Notes (Signed)
Robert White, 50 yo male, has AFib requiring anticoagulation. He recently switched from Xarelto to Eliquis 5mg  BID due to prior hematuria and wanted to try something else. It was explained that there is a lower risk of bleeding with AFib patients with Eliquis. A co-pay card ($10/month) was activated during his visit that should work for the next two years. He understood and stated how he is supposed to take the medication and to call if he has any signs/symptomms of bleeding now that he is on Eliquis.  Karlyn Agee, PharmD Candidate 07/10/15 3:30PM

## 2015-07-12 NOTE — Progress Notes (Signed)
Internal Medicine Clinic Attending  Case discussed with Dr. Boswell at the time of the visit.  We reviewed the resident's history and exam and pertinent patient test results.  I agree with the assessment, diagnosis, and plan of care documented in the resident's note.  

## 2015-07-12 NOTE — Progress Notes (Signed)
Patient was seen in clinic by Karlyn Agee, PharmD candidate. I agree with the assessment and plan of care documented.

## 2015-07-15 ENCOUNTER — Telehealth: Payer: Self-pay

## 2015-07-15 ENCOUNTER — Other Ambulatory Visit: Payer: Self-pay | Admitting: *Deleted

## 2015-07-15 NOTE — Telephone Encounter (Signed)
Sent on new enc

## 2015-07-15 NOTE — Telephone Encounter (Signed)
Please call pt regarding enalapril.

## 2015-07-15 NOTE — Telephone Encounter (Signed)
Pt states the new script for enalapril was only once daily, should be 2 times daily, please send new script cancelling out previous

## 2015-07-16 MED ORDER — ENALAPRIL MALEATE 20 MG PO TABS: 20.0000 mg | ORAL_TABLET | Freq: Two times a day (BID) | ORAL | Status: DC

## 2015-08-09 ENCOUNTER — Ambulatory Visit (HOSPITAL_COMMUNITY)
Admission: RE | Admit: 2015-08-09 | Discharge: 2015-08-09 | Disposition: A | Discharge status: Home / Self Care | Payer: 59 | Source: Ambulatory Visit | Attending: Internal Medicine | Admitting: Internal Medicine

## 2015-08-09 ENCOUNTER — Ambulatory Visit (INDEPENDENT_AMBULATORY_CARE_PROVIDER_SITE_OTHER): Payer: 59 | Admitting: Internal Medicine

## 2015-08-09 ENCOUNTER — Encounter: Payer: Self-pay | Admitting: Internal Medicine

## 2015-08-09 VITALS — BP 155/97 | HR 90 | Temp 98.1°F | Ht 70.0 in | Wt 237.2 lb

## 2015-08-09 DIAGNOSIS — I493 Ventricular premature depolarization: Secondary | ICD-10-CM | POA: Insufficient documentation

## 2015-08-09 DIAGNOSIS — I4891 Unspecified atrial fibrillation: Secondary | ICD-10-CM | POA: Diagnosis not present

## 2015-08-09 DIAGNOSIS — I10 Essential (primary) hypertension: Secondary | ICD-10-CM | POA: Insufficient documentation

## 2015-08-09 DIAGNOSIS — I4892 Unspecified atrial flutter: Secondary | ICD-10-CM

## 2015-08-09 DIAGNOSIS — R9431 Abnormal electrocardiogram [ECG] [EKG]: Secondary | ICD-10-CM | POA: Diagnosis not present

## 2015-08-09 NOTE — Assessment & Plan Note (Signed)
Patient is here after switching from losartan to enalapril at the last visit. He was slightly hypertensive today with repeat BP being 155/97 HR 104. But he attributes that to being very anxious about doctor's office visits- and I could definitely see that as he was very anxious when he talked.   He is very meticulous about checking blood pressure at home and is compliant with his meds. Review of the log shows average BP in the [id number] over diastolic of 0000000 and average heart rate of 70s.  Plan -Continue enalapril 20 mg bid -coreg 25 mg BID -diltiazem CD 240 mg -continue at home BP monitoring -follow up in 2 months

## 2015-08-09 NOTE — Progress Notes (Signed)
Patient ID: BRANDONJAMES TANGO, male   DOB: 02-Mar-1965, 50 y.o.   MRN: [id number]    CC: hypertension and afib HPI: Mr.Aitan E Alemany is a 50 y.o. man with PMH noted below here for HTN and afib.   Please see Problem List/A&P for the status of the patient's chronic medical problems   Past Medical History  Diagnosis Date  . Hypertension   . Hyperlipidemia   . Dilated cardiomyopathy ([place])     Hx of-resolves on echo 2008-EF 60-70%  . Nephrolithiasis 2012    Calcium oxalate stones  . Chronic renal insufficiency     Baseline Cr around 1.2, likely secondary to HTN  . Atrial flutter ([place])   . Atrial fibrillation, persistent ([place])   . Left ventricular dysfunction     ejection fraction 25-30% by 2-D echo    Review of Systems:  Negative, except as per HPI  Physical Exam: Filed Vitals:   08/09/15 0924 08/09/15 0947  BP: 146/102 155/97  Pulse: 104 90  Temp: 98.1 F (36.7 C)   TempSrc: Oral   Height: 5\' 10"  (1.778 m)   Weight: 237 lb 3.2 oz (107.593 kg)   SpO2: 100%     General: A&O, in NAD CV: irregul irr rhythym with rate 103, normal s1, s2, no m/r/g, no carotid bruits appreciated Resp: equal and symmetric breath sounds, no wheezing or crackles  Abdomen: soft, nontender, nondistended, +BS    Assessment & Plan:   See encounters tab for problem based medical decision making. Patient discussed with Dr. Daryll Drown

## 2015-08-09 NOTE — Patient Instructions (Signed)
Thank you for your visit today Your blood pressure log is excellent and we won't be making any changes today ! Please continue to log your blood pressures and heart rate. Please be careful in the heat and use proper precautions- like drinking plenty of fluids like propel Please follow up in 2 months with PCP, and also with the cardiologist

## 2015-08-09 NOTE — Assessment & Plan Note (Signed)
He was cardioverted on 6/12 and subsequently was in NSR with rate in the 70s.  But he seemed to be back in Afib on exam when last seen on 6/21. Today, he was tachycardic to 100s which he attributes to his white coat anxiety. He denies any dizziness, palpitations, chest pain, or dyspnea. He only felt lightheaded one time last Friday when he was working int he garage and it was very hot[place] He is compliant with all of his meds. He denies any family history of Afib, but his father had a CVA at the age of 14.   On exam, he appears to be in afib. EKG done today shows both atrial flutter in lead V1, and also Afib with RVR in lead v5 with rates in the 100s  Plan -Continue eliquis 5 mg BID, diltiazem, and coreg 25 mg BID. While his HR was in the 100s, it has been in the 70s on his home blood pressure log so I did not increase it today.  -Follow up with Cardiology. He asked me if it was worth doing a second cardioversion attempt and/or having a pacer and I explained that his cardiologist would be better able to answer that question. But medically, he is on an optimal regimen.  -explained techniques to reduce anxiety like meditation

## 2015-08-13 NOTE — Progress Notes (Signed)
Internal Medicine Clinic Attending  Case discussed with Dr. Saraiya at the time of the visit.  We reviewed the resident's history and exam and pertinent patient test results.  I agree with the assessment, diagnosis, and plan of care documented in the resident's note.  

## 2015-08-14 ENCOUNTER — Telehealth: Payer: Self-pay

## 2015-08-14 NOTE — Telephone Encounter (Signed)
Requesting the nurse to call back regarding enalapril.

## 2015-08-16 NOTE — Telephone Encounter (Signed)
Already spoke with [facility] and resolved the issue.

## 2015-08-22 ENCOUNTER — Ambulatory Visit: Payer: 59 | Admitting: Pharmacist

## 2015-08-22 ENCOUNTER — Ambulatory Visit (INDEPENDENT_AMBULATORY_CARE_PROVIDER_SITE_OTHER): Payer: 59 | Admitting: Pharmacist

## 2015-08-22 ENCOUNTER — Encounter: Payer: Self-pay | Admitting: Pharmacist

## 2015-08-22 DIAGNOSIS — Z7901 Long term (current) use of anticoagulants: Secondary | ICD-10-CM | POA: Diagnosis not present

## 2015-08-22 DIAGNOSIS — Z79899 Other long term (current) drug therapy: Secondary | ICD-10-CM

## 2015-08-22 DIAGNOSIS — Z7189 Other specified counseling: Secondary | ICD-10-CM | POA: Diagnosis not present

## 2015-08-22 NOTE — Patient Instructions (Signed)
Patient educated about medication as defined in this encounter and verbalized understanding by repeating back instructions provided.   

## 2015-08-22 NOTE — Progress Notes (Signed)
Medication Samples have been provided to the patient.  Drug name: Eliquis (apixaban)       Strength: 5 mg        Qty: 28  LOT: [id number]  Exp.Date: 09/2017  Dosing instructions: 1 tablet BID  The patient has been instructed and correctly verbalized the correct time, dose, and frequency of taking this medication, including desired effects and most common side effects.   Kanani Mowbray J 11:34 AM 08/22/2015  Patient was able to successfully activate $10 copay card for future medication access.

## 2015-09-04 ENCOUNTER — Other Ambulatory Visit: Payer: Self-pay | Admitting: Cardiovascular Disease

## 2015-10-04 ENCOUNTER — Other Ambulatory Visit: Payer: Self-pay | Admitting: Cardiovascular Disease

## 2015-12-03 ENCOUNTER — Other Ambulatory Visit: Payer: Self-pay | Admitting: Internal Medicine

## 2015-12-03 NOTE — Telephone Encounter (Signed)
Refill request sent to pcp for consideration.  Pt also due for office visit-request sent to front office for scheduling.Robert White, [name]11/14/201711:06 AM

## 2015-12-04 ENCOUNTER — Other Ambulatory Visit: Payer: Self-pay | Admitting: Internal Medicine

## 2015-12-04 ENCOUNTER — Other Ambulatory Visit: Payer: Self-pay | Admitting: Cardiovascular Disease

## 2015-12-05 ENCOUNTER — Other Ambulatory Visit: Payer: Self-pay | Admitting: Internal Medicine

## 2015-12-25 ENCOUNTER — Ambulatory Visit (INDEPENDENT_AMBULATORY_CARE_PROVIDER_SITE_OTHER): Payer: 59 | Admitting: Cardiovascular Disease

## 2015-12-25 ENCOUNTER — Encounter: Payer: Self-pay | Admitting: Cardiovascular Disease

## 2015-12-25 VITALS — BP 162/98 | HR 111 | Ht 70.0 in | Wt 234.8 lb

## 2015-12-25 DIAGNOSIS — I4891 Unspecified atrial fibrillation: Secondary | ICD-10-CM | POA: Diagnosis not present

## 2015-12-25 DIAGNOSIS — I519 Heart disease, unspecified: Secondary | ICD-10-CM | POA: Diagnosis not present

## 2015-12-25 DIAGNOSIS — I10 Essential (primary) hypertension: Secondary | ICD-10-CM

## 2015-12-25 NOTE — Assessment & Plan Note (Signed)
History of symptomatic atrial flutter status post elective operation patient cardioversion by Dr. [name] 07/01/15 sinus rhythm. His EF at that time was 4545%. He thinks he converted back to A. fib soon thereafter and he is in A. fib today with ventricular response of 111 although he says his heart rate runs in the 60-80 range at home. He is totally asymptomatic on oral anticoagulation. At this point, I am going to maintain him in atrial fibrillation with rate control and anticoagulation.

## 2015-12-25 NOTE — Assessment & Plan Note (Signed)
History of hyperlipidemia on statin therapy followed by his PCP 

## 2015-12-25 NOTE — Patient Instructions (Addendum)
Medication Instructions: Your physician recommends that you continue on your current medications as directed. Please refer to the Current Medication list given to you today.  Testing:  Your physician has requested that you have an echocardiogram. Echocardiography is a painless test that uses sound waves to create images of your heart. It provides your doctor with information about the size and shape of your heart and how well your heart's chambers and valves are working. This procedure takes approximately one hour. There are no restrictions for this procedure. This will be performed at [address].   Follow-Up: We request that you follow-up in: 6 months with an extender and in 12 months with Dr Andria Rhein will receive a reminder letter in the mail two months in advance. If you don't receive a letter, please call our office to schedule the follow-up appointment.  If you need a refill on your cardiac medications before your next appointment, please call your pharmacy.

## 2015-12-25 NOTE — Progress Notes (Signed)
12/25/2015 [name]   17-May-1965  [id number]  Primary Physician Maryellen Pile, MD Primary Cardiologist: Lorretta Harp MD Renae Gloss  HPI:  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 I last saw him in the office 06/18/15. He has a history of hypertension hyperlipidemia. He has never had a heart attack or stroke. He denies chest pain or shortness of breath. Over the last several weeks she's noticed tachypalpitations and was evaluated by the internal medicine service at [facility] although EKG was not performed.his lab work is remarkable for a low TSH at the lower limit of normal. Today in the office he has a flutter with a heart rate of 125 and 2:1 conduction. He is on a beta blocker.I added Cardizem CD 240 mg and today in the office he is in atrial fibrillation with a ventricular response of 107. A 2-D echo showed a decline in his ejection fraction from the 70% range down to 25-30%. I arranged for him to undergo outpatient cardioversion by Dr. [name] on 07/01/15 which was successful. His EF at that time was 40-45%. It appears that he has converted back to A. fib rate controlled on his current medications as well as oral anticoagulation. He is completely asymptomatic.    Current Outpatient Prescriptions  Medication Sig Dispense Refill  . atorvastatin (LIPITOR) 20 MG tablet TAKE 1 TABLET (20 MG TOTAL) BY MOUTH DAILY AT 6 PM. 90 tablet 2  . carvedilol (COREG) 25 MG tablet TAKE 1 TABLET (25 MG TOTAL) BY MOUTH 2 (TWO) TIMES DAILY WITH A MEAL. 180 tablet 3  . diltiazem (CARDIZEM CD) 240 MG 24 hr capsule TAKE ONE CAPSULE BY MOUTH EVERY DAY 30 capsule 1  . ELIQUIS 5 MG TABS tablet TAKE 1 TABLET (5 MG TOTAL) BY MOUTH 2 (TWO) TIMES DAILY. 60 tablet 5  . enalapril (VASOTEC) 20 MG tablet Take 1 tablet (20 mg total) by mouth 2 (two) times daily. 180 tablet 1  . OVER  THE COUNTER MEDICATION Place 1 drop into both eyes daily as needed (dry eyes). Over the counter lubricating eye drop    . Pyridoxine HCl (VITAMIN B-6 PO) Take 1 tablet by mouth daily.     No current facility-administered medications for this visit.     Allergies  Allergen Reactions  . Cephalexin Swelling  . Penicillins Swelling    Lip swelling Has patient had a PCN reaction causing immediate rash, facial/tongue/throat swelling, SOB or lightheadedness with hypotension: Yes Has patient had a PCN reaction causing severe rash involving mucus membranes or skin necrosis: No Has patient had a PCN reaction that required hospitalization No Has patient had a PCN reaction occurring within the last 10 years: Yes If all of the above answers are "NO", then may proceed with Cephalosporin use.    Social History   Social History  . Marital status: Married    Spouse name: N/A  . Number of children: N/A  . Years of education: N/A   Occupational History  . Not on file.   Social History Main Topics  . Smoking status: Never Smoker  . Smokeless tobacco: Never Used  . Alcohol use No  . Drug use: No  . Sexual activity: Not on file   Other Topics Concern  . Not on file   Social History Narrative   Married, 2 daughters ages 16,20 (2012). Exercises regularly.  Review of Systems: General: negative for chills, fever, night sweats or weight changes.  Cardiovascular: negative for chest pain, dyspnea on exertion, edema, orthopnea, palpitations, paroxysmal nocturnal dyspnea or shortness of breath Dermatological: negative for rash Respiratory: negative for cough or wheezing Urologic: negative for hematuria Abdominal: negative for nausea, vomiting, diarrhea, bright red blood per rectum, melena, or hematemesis Neurologic: negative for visual changes, syncope, or dizziness All other systems reviewed and are otherwise negative except as noted above.    Blood pressure (!) 162/98, pulse (!) 111,  height 5\' 10"  (1.778 m), weight 234 lb 12.8 oz (106.5 kg).  General appearance: alert and no distress Neck: no adenopathy, no carotid bruit, no JVD, supple, symmetrical, trachea midline and thyroid not enlarged, symmetric, no tenderness/mass/nodules Lungs: clear to auscultation bilaterally Heart: irregularly irregular rhythm Extremities: extremities normal, atraumatic, no cyanosis or edema  EKG atrial fibrillation with a ventricular response of 111, nonspecific ST-T wave changes with occasional aberrantly conducted beats.  ASSESSMENT AND PLAN:   Essential hypertension History of hypertension with blood pressure measures 162/98 heart rate of 111. He says it's usually somewhat lower than this. He is on carvedilol 20 mg by mouth twice a day, Cardizem CD 240, enalapril 20 mg twice a day. Continue current meds at current dosing  HYPERLIPIDEMIA History of hyperlipidemia on statin therapy followed by his PCP  Atrial flutter ([place]) History of symptomatic atrial flutter status post elective operation patient cardioversion by Dr. [name] 07/01/15 sinus rhythm. His EF at that time was 4545%. He thinks he converted back to A. fib soon thereafter and he is in A. fib today with ventricular response of 111 although he says his heart rate runs in the 60-80 range at home. He is totally asymptomatic on oral anticoagulation. At this point, I am going to maintain him in atrial fibrillation with rate control and anticoagulation.  Left ventricular dysfunction Mr. [name] has an EF in the 40-45% range down from near normal EF prior to his atrial flutter. I suspect this is tachycardia mediated. He is completely asymptomatic however.      Lorretta Harp MD FACP,FACC,FAHA, [facility] 12/25/2015 2:46 PM

## 2015-12-25 NOTE — Assessment & Plan Note (Signed)
Robert White has an EF in the 40-45% range down from near normal EF prior to his atrial flutter. I suspect this is tachycardia mediated. He is completely asymptomatic however.

## 2015-12-25 NOTE — Assessment & Plan Note (Signed)
History of hypertension with blood pressure measures 162/98 heart rate of 111. He says it's usually somewhat lower than this. He is on carvedilol 20 mg by mouth twice a day, Cardizem CD 240, enalapril 20 mg twice a day. Continue current meds at current dosing

## 2016-01-07 NOTE — Progress Notes (Signed)
   CC: HTN follow up   HPI:  [name] is a 50 y.o. male with a past medical history listed below here today for follow up of his HTN.  A. Flutter - Followed by Dr. Gwenlyn Found. Had cardioversion on 6/12 with conversion to NSR. At time of follow up with me on 6/21 he had converted back in to a. Fib back exam. Followed up with Dr. Gwenlyn Found on 12/6 and noted to be in a. Fib with ventricular response of 111. Opted to manage him with rate control and anticoagulation at that time. Today, his HR is 78. He denies any symptoms. Cannot feel any palpitations. Reports his heart at home is in the 70s. Compliant with his Eliquis 5 mg bid, Diltiazem 240 mg CD and Coreg 25 mg bid. CHAD-Vasc 1.  HTN - BP today 148/114. Currently on Enalapril 20 mg bid, Coreg 25 mg bid, Cardizem CD 240 mg daily. Checks his blood pressures routinely at home which normally run 143/88-92. Before changed from enalapril to the losartan he reports usually being 128-138/85-89. Feels good and denies any symptoms today.   LV Dysfunction - EF on TEE 06/2015 of 40-45%, improved from prior 2D ECHO on 06/06/15 with EF 25-30%. Also with mildly dilated LV and mild concentric LVH. Suspect this is related to his atrial fibrillation and will improve once controlled. Followed by cards. Is in persistent a. Fib but rate controlled. Denies any shortness, LE edema, orthopnea, PND.   Chronic Renal Insufficiency - Denies any urinary symptoms  HLD - Currently on Lipitor 20 mg daily. Last lipid panel 09/2010. Will re-check today.   Preventative Health Care - Reports he already recived the flu shot. HCV screening.   Past Medical History:  Diagnosis Date  . Atrial fibrillation, persistent ([place])   . Atrial flutter ([place])   . Chronic renal insufficiency    Baseline Cr around 1.2, likely secondary to HTN  . Dilated cardiomyopathy ([place])    Hx of-resolves on echo 2008-EF 60-70%  . Hyperlipidemia   . Hypertension   . Left ventricular dysfunction    ejection  fraction 25-30% by 2-D echo  . Nephrolithiasis 2012   Calcium oxalate stones    Review of Systems:   Negative except as noted in HPI  Physical Exam:  Vitals:   01/08/16 1442  BP: (!) 148/114  Pulse: 78  Temp: 98.3 F (36.8 C)  TempSrc: Oral  SpO2: 100%  Weight: 236 lb 9.6 oz (107.3 kg)  Height: 5\' 10"  (1.778 m)   Physical Exam  Constitutional: He is oriented to person, place, and time and well-developed, well-nourished, and in no distress. No distress.  Cardiovascular: Exam reveals no gallop and no friction rub.   No murmur heard. Irregular rhythm, normal rate  Pulmonary/Chest: Effort normal and breath sounds normal. No respiratory distress.  Abdominal: Soft. Bowel sounds are normal. He exhibits no distension.  Neurological: He is alert and oriented to person, place, and time.     Assessment & Plan:   See Encounters Tab for problem based charting.  Patient discussed with Dr. Dareen Piano

## 2016-01-08 ENCOUNTER — Encounter: Payer: Self-pay | Admitting: Internal Medicine

## 2016-01-08 ENCOUNTER — Ambulatory Visit (INDEPENDENT_AMBULATORY_CARE_PROVIDER_SITE_OTHER): Payer: 59 | Admitting: Internal Medicine

## 2016-01-08 VITALS — BP 140/72 | HR 78 | Temp 98.3°F | Ht 70.0 in | Wt 236.6 lb

## 2016-01-08 DIAGNOSIS — I519 Heart disease, unspecified: Secondary | ICD-10-CM | POA: Diagnosis not present

## 2016-01-08 DIAGNOSIS — N189 Chronic kidney disease, unspecified: Secondary | ICD-10-CM | POA: Diagnosis not present

## 2016-01-08 DIAGNOSIS — Z7901 Long term (current) use of anticoagulants: Secondary | ICD-10-CM

## 2016-01-08 DIAGNOSIS — E785 Hyperlipidemia, unspecified: Secondary | ICD-10-CM | POA: Diagnosis not present

## 2016-01-08 DIAGNOSIS — I129 Hypertensive chronic kidney disease with stage 1 through stage 4 chronic kidney disease, or unspecified chronic kidney disease: Secondary | ICD-10-CM | POA: Diagnosis not present

## 2016-01-08 DIAGNOSIS — I4892 Unspecified atrial flutter: Secondary | ICD-10-CM

## 2016-01-08 DIAGNOSIS — Z79899 Other long term (current) drug therapy: Secondary | ICD-10-CM

## 2016-01-08 DIAGNOSIS — I10 Essential (primary) hypertension: Secondary | ICD-10-CM

## 2016-01-08 DIAGNOSIS — Z0000 Encounter for general adult medical examination without abnormal findings: Secondary | ICD-10-CM

## 2016-01-08 NOTE — Patient Instructions (Signed)
Robert White,  Thank you for coming in today. Your blood pressure is still doing well. It is a little higher than I would like but we will keep an eye on it.   I would like to follow up 2-3 months for follow up.

## 2016-01-09 LAB — BMP8+ANION GAP
ANION GAP: 16 mmol/L (ref 10.0–18.0)
BUN/Creatinine Ratio: 14 (ref 9–20)
BUN: 16 mg/dL (ref 6–24)
CALCIUM: 9.2 mg/dL (ref 8.7–10.2)
CHLORIDE: 103 mmol/L (ref 96–106)
CO2: 24 mmol/L (ref 18–29)
Creatinine, Ser: 1.17 mg/dL (ref 0.76–1.27)
GFR calc Af Amer: 83 mL/min/{1.73_m2} (ref >59)
GFR, EST NON AFRICAN AMERICAN: 72 mL/min/{1.73_m2} (ref >59)
GLUCOSE: 78 mg/dL (ref 65–99)
POTASSIUM: 4.1 mmol/L (ref 3.5–5.2)
SODIUM: 143 mmol/L (ref 134–144)

## 2016-01-09 LAB — LIPID PANEL
Chol/HDL Ratio: 3.1 ratio units (ref 0.0–5.0)
Cholesterol, Total: 138 mg/dL (ref 100–199)
HDL: 44 mg/dL (ref >39)
LDL Calculated: 83 mg/dL (ref 0–99)
Triglycerides: 57 mg/dL (ref 0–149)
VLDL Cholesterol Cal: 11 mg/dL (ref 5–40)

## 2016-01-09 LAB — HEPATITIS C ANTIBODY: HEP C VIRUS AB: 0.1 {s_co_ratio} (ref 0.0–0.9)

## 2016-01-09 NOTE — Assessment & Plan Note (Signed)
Reports he already recived the flu shot. HCV screening today was negative.

## 2016-01-09 NOTE — Assessment & Plan Note (Signed)
Followed by Dr. Gwenlyn Found. Had cardioversion on 6/12 with conversion to NSR. At time of follow up with me on 6/21 he had converted back in to a. Fib back exam. Followed up with Dr. Gwenlyn Found on 12/6 and noted to be in a. Fib with ventricular response of 111. Opted to manage him with rate control and anticoagulation at that time. Today, his HR is 78. He denies any symptoms. Cannot feel any palpitations. Reports his heart at home is in the 70s. Compliant with his Eliquis 5 mg bid, Diltiazem 240 mg CD and Coreg 25 mg bid. CHAD-Vasc 1.  Plan Continue current medications

## 2016-01-09 NOTE — Assessment & Plan Note (Signed)
Denies any symptoms   Plan BMET today  Cr 1.17 today

## 2016-01-09 NOTE — Assessment & Plan Note (Signed)
EF on TEE 06/2015 of 40-45%, improved from prior 2D ECHO on 06/06/15 with EF 25-30%. Also with mildly dilated LV and mild concentric LVH. Suspect this is related to his atrial fibrillation and will improve once controlled. Followed by cards. Is in persistent a. Fib but rate controlled. Denies any shortness, LE edema, orthopnea, PND.   Plan Follow up ECHO in 1 week

## 2016-01-09 NOTE — Assessment & Plan Note (Signed)
Currently on Lipitor 20 mg daily. Last lipid panel 09/2010.   Plan Lipid panel today

## 2016-01-09 NOTE — Assessment & Plan Note (Signed)
BP Readings from Last 3 Encounters:  01/08/16 140/72  12/25/15 (!) 162/98  08/09/15 (!) 155/97    Lab Results  Component Value Date   NA 143 01/08/2016   K 4.1 01/08/2016   CREATININE 1.17 01/08/2016    Assessment: BP today 148/114. Currently on Enalapril 20 mg bid, Coreg 25 mg bid, Cardizem CD 240 mg daily. Checks his blood pressures routinely at home which normally run 143/88-92. Before changed from enalapril to the losartan he reports usually being 128-138/85-89. Feels good and denies any symptoms today.   BP on recheck was 140/72.  Plan: Will continue current medications

## 2016-01-10 NOTE — Progress Notes (Signed)
Internal Medicine Clinic Attending  Case discussed with Dr. Boswell at the time of the visit.  We reviewed the resident's history and exam and pertinent patient test results.  I agree with the assessment, diagnosis, and plan of care documented in the resident's note.  

## 2016-01-15 ENCOUNTER — Other Ambulatory Visit (HOSPITAL_COMMUNITY): Payer: 59

## 2016-01-22 ENCOUNTER — Ambulatory Visit: Payer: 59 | Admitting: Cardiovascular Disease

## 2016-01-30 ENCOUNTER — Other Ambulatory Visit: Payer: Self-pay | Admitting: Cardiovascular Disease

## 2016-02-06 ENCOUNTER — Other Ambulatory Visit (HOSPITAL_COMMUNITY): Payer: 59

## 2016-02-10 ENCOUNTER — Other Ambulatory Visit: Payer: Self-pay | Admitting: Internal Medicine

## 2016-02-10 DIAGNOSIS — R319 Hematuria, unspecified: Secondary | ICD-10-CM | POA: Diagnosis not present

## 2016-02-10 DIAGNOSIS — R509 Fever, unspecified: Secondary | ICD-10-CM | POA: Diagnosis not present

## 2016-02-10 DIAGNOSIS — N390 Urinary tract infection, site not specified: Secondary | ICD-10-CM | POA: Diagnosis not present

## 2016-02-10 DIAGNOSIS — R3915 Urgency of urination: Secondary | ICD-10-CM | POA: Diagnosis not present

## 2016-02-11 ENCOUNTER — Other Ambulatory Visit (HOSPITAL_COMMUNITY): Payer: 59

## 2016-02-25 DIAGNOSIS — R350 Frequency of micturition: Secondary | ICD-10-CM | POA: Diagnosis not present

## 2016-03-03 ENCOUNTER — Encounter: Payer: Self-pay | Admitting: Cardiovascular Disease

## 2016-03-03 ENCOUNTER — Encounter (INDEPENDENT_AMBULATORY_CARE_PROVIDER_SITE_OTHER): Payer: Self-pay

## 2016-03-03 ENCOUNTER — Ambulatory Visit (HOSPITAL_COMMUNITY): Payer: 59 | Attending: Cardiology

## 2016-03-03 ENCOUNTER — Other Ambulatory Visit: Payer: Self-pay

## 2016-03-03 DIAGNOSIS — I4891 Unspecified atrial fibrillation: Secondary | ICD-10-CM | POA: Diagnosis not present

## 2016-03-03 DIAGNOSIS — I270 Primary pulmonary hypertension: Secondary | ICD-10-CM | POA: Insufficient documentation

## 2016-03-03 DIAGNOSIS — I77819 Aortic ectasia, unspecified site: Secondary | ICD-10-CM | POA: Insufficient documentation

## 2016-03-03 DIAGNOSIS — I340 Nonrheumatic mitral (valve) insufficiency: Secondary | ICD-10-CM | POA: Diagnosis not present

## 2016-03-03 NOTE — Progress Notes (Signed)
Pt was seen for an echo today Robert White brought his chart around for me to review Patient has atrial flutter with HR around 120. EF 25%.  This is similar to his last exam June, 2017.  Pt is not unstable .   I've spoken to Dr. Gwenlyn Found and he would like to send him to the Afib clinic Will see if we can arrange to for this week    Mertie Moores, MD  03/03/2016 1:38 PM    [address],  [place], [place]  [address] Pager [phone] Phone: [phone]; Fax: [phone]

## 2016-03-03 NOTE — Progress Notes (Unsigned)
Mr. Burningham presented for echocardiogram this afternoon. He was found to have a-fib with RVR with HR 120-140bpm. Dr. Acie Fredrickson (DOD) was notified. He spoke with Dr. Alvester Chou and he recommended the patent to be seen at the a-fib clinic. Patient is asymptomatic so Dr. Acie Fredrickson was comfortable to send him home.   Robert White, [place] 03/03/2016

## 2016-03-03 NOTE — Progress Notes (Signed)
[name] presented for echocardiogram this afternoon. He was found to have a-fib with RVR with HR 120-140bpm. Dr. Acie Fredrickson (DOD) was notified. He spoke with Dr. Alvester Chou and he recommended the patent to be seen at the a-fib clinic. Patient is asymptomatic so Dr. Acie Fredrickson was comfortable to send him home.   Robert White, [place] 03/03/2016

## 2016-03-05 ENCOUNTER — Other Ambulatory Visit (HOSPITAL_COMMUNITY): Payer: Self-pay | Admitting: *Deleted

## 2016-03-05 ENCOUNTER — Telehealth: Payer: Self-pay | Admitting: *Deleted

## 2016-03-05 ENCOUNTER — Ambulatory Visit (HOSPITAL_COMMUNITY)
Admission: RE | Admit: 2016-03-05 | Discharge: 2016-03-05 | Disposition: A | Discharge status: Home / Self Care | Payer: 59 | Source: Ambulatory Visit | Attending: Nurse Practitioner | Admitting: Nurse Practitioner

## 2016-03-05 ENCOUNTER — Encounter (HOSPITAL_COMMUNITY): Payer: Self-pay | Admitting: Nurse Practitioner

## 2016-03-05 VITALS — BP 156/94 | HR 139 | Ht 70.0 in | Wt 234.2 lb

## 2016-03-05 DIAGNOSIS — E785 Hyperlipidemia, unspecified: Secondary | ICD-10-CM | POA: Diagnosis not present

## 2016-03-05 DIAGNOSIS — Z8042 Family history of malignant neoplasm of prostate: Secondary | ICD-10-CM | POA: Insufficient documentation

## 2016-03-05 DIAGNOSIS — Z9889 Other specified postprocedural states: Secondary | ICD-10-CM | POA: Diagnosis not present

## 2016-03-05 DIAGNOSIS — Z79899 Other long term (current) drug therapy: Secondary | ICD-10-CM | POA: Diagnosis not present

## 2016-03-05 DIAGNOSIS — I4819 Other persistent atrial fibrillation: Secondary | ICD-10-CM

## 2016-03-05 DIAGNOSIS — I481 Persistent atrial fibrillation: Secondary | ICD-10-CM

## 2016-03-05 DIAGNOSIS — I129 Hypertensive chronic kidney disease with stage 1 through stage 4 chronic kidney disease, or unspecified chronic kidney disease: Secondary | ICD-10-CM | POA: Insufficient documentation

## 2016-03-05 DIAGNOSIS — Z888 Allergy status to other drugs, medicaments and biological substances status: Secondary | ICD-10-CM | POA: Insufficient documentation

## 2016-03-05 DIAGNOSIS — I4892 Unspecified atrial flutter: Secondary | ICD-10-CM | POA: Insufficient documentation

## 2016-03-05 DIAGNOSIS — Z880 Allergy status to penicillin: Secondary | ICD-10-CM | POA: Diagnosis not present

## 2016-03-05 DIAGNOSIS — I4891 Unspecified atrial fibrillation: Secondary | ICD-10-CM

## 2016-03-05 DIAGNOSIS — Z800 Family history of malignant neoplasm of digestive organs: Secondary | ICD-10-CM | POA: Diagnosis not present

## 2016-03-05 DIAGNOSIS — Z7901 Long term (current) use of anticoagulants: Secondary | ICD-10-CM | POA: Diagnosis not present

## 2016-03-05 DIAGNOSIS — I420 Dilated cardiomyopathy: Secondary | ICD-10-CM | POA: Diagnosis not present

## 2016-03-05 LAB — BASIC METABOLIC PANEL
ANION GAP: 8 (ref 5–15)
BUN: 19 mg/dL (ref 6–20)
CALCIUM: 9 mg/dL (ref 8.9–10.3)
CHLORIDE: 106 mmol/L (ref 101–111)
CO2: 27 mmol/L (ref 22–32)
Creatinine, Ser: 1.55 mg/dL — ABNORMAL HIGH (ref 0.61–1.24)
GFR calc Af Amer: 58 mL/min — ABNORMAL LOW (ref >60)
GFR calc non Af Amer: 50 mL/min — ABNORMAL LOW (ref >60)
GLUCOSE: 108 mg/dL — AB (ref 65–99)
Potassium: 3.9 mmol/L (ref 3.5–5.1)
Sodium: 141 mmol/L (ref 135–145)

## 2016-03-05 LAB — MAGNESIUM: Magnesium: 2.1 mg/dL (ref 1.7–2.4)

## 2016-03-05 MED ORDER — POTASSIUM CHLORIDE ER 10 MEQ PO TBCR: 10.0000 meq | EXTENDED_RELEASE_TABLET | Freq: Every day | ORAL | 6 refills | Status: DC

## 2016-03-05 NOTE — Telephone Encounter (Signed)
-----   Message from Juluis Mire, RN sent at 03/05/2016 10:17 AM EST ----- Regarding: sleep study Pt needs to be scheduled for sleep study for afib. Pt will be expecting call. Thanks [employer]

## 2016-03-05 NOTE — Telephone Encounter (Signed)
Order in.

## 2016-03-05 NOTE — Patient Instructions (Signed)
Scheduler will be in touch with you regarding sleep study scheduling.

## 2016-03-05 NOTE — Progress Notes (Signed)
Primary Care Physician: Maryellen Pile, MD Referring Physician: Dr. Lindell Spar is a 51 y.o. male with a h/o HTN,  afib/flutter that was first diagnosed in May of this year. He was started on Eliquis and cardioverted. He however had ERAF. Echo in May showed EF of 25-30%, with Left atrium of 50 mm. EF was changed from a previous echo showing EF of 70% from 2008.   He reports that he was treated at [facility] last winter/spring and lost around 50 lbs. He was given two vitamins and one prescription drug that he does not remember the name. He started feeling his heart race about this time. He recently had repeat echo that showed EF at 25-30% but now left atrium is 60 mm. He does not drink caffeine or alcohol. He is very active in his job and so far has tolerated arrhythmia. No edema or PND/orthopnea. Wife reports years of snoring and witnessed apnea spells and he has not had a sleep apnea test. Even though pt has rvr with MD visits, at home he states that his rates are controlled. Gets anxious at MD office.  Today, he denies symptoms of palpitations, chest pain, shortness of breath, orthopnea, PND, lower extremity edema, dizziness, presyncope, syncope, or neurologic sequela. The patient is tolerating medications without difficulties and is otherwise without complaint today.   Past Medical History:  Diagnosis Date  . Atrial fibrillation, persistent ([place])   . Atrial flutter ([place])   . Chronic renal insufficiency    Baseline Cr around 1.2, likely secondary to HTN  . Dilated cardiomyopathy ([place])    Hx of-resolves on echo 2008-EF 60-70%  . Hyperlipidemia   . Hypertension   . Left ventricular dysfunction    ejection fraction 25-30% by 2-D echo  . Nephrolithiasis 2012   Calcium oxalate stones   Past Surgical History:  Procedure Laterality Date  . CARDIOVERSION N/A 07/01/2015   Procedure: CARDIOVERSION;  Surgeon: Sanda Klein, MD;  Location: [facility] ENDOSCOPY;  Service:  Cardiovascular;  Laterality: N/A;  . TEE WITHOUT CARDIOVERSION N/A 07/01/2015   Procedure: TRANSESOPHAGEAL ECHOCARDIOGRAM (TEE);  Surgeon: Sanda Klein, MD;  Location: [facility] ENDOSCOPY;  Service: Cardiovascular;  Laterality: N/A;  . WISDOM TOOTH EXTRACTION      Current Outpatient Prescriptions  Medication Sig Dispense Refill  . atorvastatin (LIPITOR) 20 MG tablet TAKE 1 TABLET (20 MG TOTAL) BY MOUTH DAILY AT 6 PM. 90 tablet 2  . carvedilol (COREG) 25 MG tablet TAKE 1 TABLET (25 MG TOTAL) BY MOUTH 2 (TWO) TIMES DAILY WITH A MEAL. 180 tablet 3  . diltiazem (CARDIZEM CD) 240 MG 24 hr capsule TAKE ONE CAPSULE BY MOUTH EVERY DAY 30 capsule 11  . ELIQUIS 5 MG TABS tablet TAKE 1 TABLET (5 MG TOTAL) BY MOUTH 2 (TWO) TIMES DAILY. 60 tablet 5  . enalapril (VASOTEC) 20 MG tablet TAKE 1 TABLET BY MOUTH TWICE A DAY 180 tablet 1  . OVER THE COUNTER MEDICATION Place 1 drop into both eyes daily as needed (dry eyes). Over the counter lubricating eye drop    . potassium chloride (K-DUR) 10 MEQ tablet Take 1 tablet (10 mEq total) by mouth daily. 30 tablet 6   No current facility-administered medications for this encounter.     Allergies  Allergen Reactions  . Cephalexin Swelling  . Penicillins Swelling    Lip swelling Has patient had a PCN reaction causing immediate rash, facial/tongue/throat swelling, SOB or lightheadedness with hypotension: Yes Has patient had a PCN  reaction causing severe rash involving mucus membranes or skin necrosis: No Has patient had a PCN reaction that required hospitalization No Has patient had a PCN reaction occurring within the last 10 years: Yes If all of the above answers are "NO", then may proceed with Cephalosporin use.    Social History   Social History  . Marital status: Married    Spouse name: N/A  . Number of children: N/A  . Years of education: N/A   Occupational History  . Not on file.   Social History Main Topics  . Smoking status: Never Smoker  .  Smokeless tobacco: Never Used  . Alcohol use No  . Drug use: No  . Sexual activity: Not on file   Other Topics Concern  . Not on file   Social History Narrative   Married, 2 daughters ages 62,20 (2012). Exercises regularly.    Family History  Problem Relation Age of Onset  . Prostate cancer Father   . Colon cancer Neg Hx     ROS- All systems are reviewed and negative except as per the HPI above  Physical Exam: Vitals:   03/05/16 0937  BP: (!) 156/94  Pulse: (!) 139  Weight: 234 lb 3.2 oz (106.2 kg)  Height: 5\' 10"  (1.778 m)   Wt Readings from Last 3 Encounters:  03/05/16 234 lb 3.2 oz (106.2 kg)  01/08/16 236 lb 9.6 oz (107.3 kg)  12/25/15 234 lb 12.8 oz (106.5 kg)    Labs: Lab Results  Component Value Date   NA 141 03/05/2016   K 3.9 03/05/2016   CL 106 03/05/2016   CO2 27 03/05/2016   GLUCOSE 108 (H) 03/05/2016   BUN 19 03/05/2016   CREATININE 1.55 (H) 03/05/2016   CALCIUM 9.0 03/05/2016   MG 2.1 03/05/2016   No results found for: INR Lab Results  Component Value Date   CHOL 138 01/08/2016   HDL 44 01/08/2016   LDLCALC 83 01/08/2016   TRIG 57 01/08/2016     GEN- The patient is well appearing, alert and oriented x 3 today.   Head- normocephalic, atraumatic Eyes-  Sclera clear, conjunctiva pink Ears- hearing intact Oropharynx- clear Neck- supple, no JVP Lymph- no cervical lymphadenopathy Lungs- Clear to ausculation bilaterally, normal work of breathing Heart- Rapid irregular rate and rhythm, no murmurs, rubs or gallops, PMI not laterally displaced GI- soft, NT, ND, + BS Extremities- no clubbing, cyanosis, or edema MS- no significant deformity or atrophy Skin- no rash or lesion Psych- euthymic mood, full affect Neuro- strength and sensation are intact  EKG- Afib with rvr at 139 bpm Echo-Study Conclusions  - Left ventricle: The cavity size was moderately dilated. Wall   thickness was increased in a pattern of mild LVH. Systolic   function  was severely reduced. The estimated ejection fraction   was in the range of 25% to 30%. Diffuse hypokinesis. - Aortic root: The aortic root was mildly dilated. - Mitral valve: There was mild regurgitation. - Left atrium: The atrium was severely dilated. - Right ventricle: The cavity size was mildly dilated. - Right atrium: The atrium was mildly dilated. - Pulmonary arteries: PA peak pressure: 35 mm Hg (S).  Impressions:  - Severe global reduction in LV systolic function; mild LVH; mildly   dilated aortic root; mild MR; severe LAE; mild RAE/RVE; trace TR   with mildly elevated pulmonary pressure.    Assessment and Plan: 1. Persistent afib/flutter with RVR I feel that is is important to try  to restore SR with LV dysfunction that is probably [facility] He will continue carvedilol 25 mg bid and cardizem 240 mg qd He is not  a candidate for multaq or flecainide due to LV dysfunction He is on the young side for amiodarone I discussed with Dr. Rayann Heman and it is felt that [name] is a good option He is willing to be admitted for initiation Wife will check on price of drug QTc is acceptable in SR at 417 ms Continue eliquis 5 mg without missed doses for CHA2DS2VASc score of at least two Creat cl cal at 83.70 No current meds that would prolong qtc Sleep study to be scheduled Bmet/mag today in anticipation of starting [place]. Zenith Lamph[facility], [facility] [address] [place], [place] [address] [phone]

## 2016-03-10 ENCOUNTER — Encounter: Payer: Self-pay | Admitting: Pharmacist

## 2016-03-10 ENCOUNTER — Inpatient Hospital Stay (HOSPITAL_COMMUNITY)
Admission: AD | Admit: 2016-03-10 | Discharge: 2016-03-13 | DRG: 310 | Disposition: A | Discharge status: Home / Self Care | Payer: 59 | Source: Ambulatory Visit | Attending: Internal Medicine | Admitting: Internal Medicine

## 2016-03-10 ENCOUNTER — Ambulatory Visit (INDEPENDENT_AMBULATORY_CARE_PROVIDER_SITE_OTHER): Payer: 59 | Admitting: Pharmacist

## 2016-03-10 ENCOUNTER — Other Ambulatory Visit: Payer: Self-pay

## 2016-03-10 VITALS — Wt 234.5 lb

## 2016-03-10 DIAGNOSIS — I481 Persistent atrial fibrillation: Secondary | ICD-10-CM | POA: Diagnosis not present

## 2016-03-10 DIAGNOSIS — N182 Chronic kidney disease, stage 2 (mild): Secondary | ICD-10-CM | POA: Diagnosis not present

## 2016-03-10 DIAGNOSIS — Z7901 Long term (current) use of anticoagulants: Secondary | ICD-10-CM | POA: Diagnosis not present

## 2016-03-10 DIAGNOSIS — I4891 Unspecified atrial fibrillation: Secondary | ICD-10-CM | POA: Diagnosis not present

## 2016-03-10 DIAGNOSIS — Z880 Allergy status to penicillin: Secondary | ICD-10-CM

## 2016-03-10 DIAGNOSIS — I420 Dilated cardiomyopathy: Secondary | ICD-10-CM | POA: Diagnosis present

## 2016-03-10 DIAGNOSIS — I4819 Other persistent atrial fibrillation: Secondary | ICD-10-CM

## 2016-03-10 DIAGNOSIS — E785 Hyperlipidemia, unspecified: Secondary | ICD-10-CM | POA: Diagnosis not present

## 2016-03-10 DIAGNOSIS — I4892 Unspecified atrial flutter: Secondary | ICD-10-CM | POA: Diagnosis not present

## 2016-03-10 DIAGNOSIS — I129 Hypertensive chronic kidney disease with stage 1 through stage 4 chronic kidney disease, or unspecified chronic kidney disease: Secondary | ICD-10-CM | POA: Diagnosis not present

## 2016-03-10 DIAGNOSIS — Z8042 Family history of malignant neoplasm of prostate: Secondary | ICD-10-CM

## 2016-03-10 DIAGNOSIS — E876 Hypokalemia: Secondary | ICD-10-CM | POA: Diagnosis not present

## 2016-03-10 LAB — BASIC METABOLIC PANEL
BUN/Creatinine Ratio: 14 (ref 9–20)
BUN: 20 mg/dL (ref 6–24)
CO2: 23 mmol/L (ref 18–29)
Calcium: 9 mg/dL (ref 8.7–10.2)
Chloride: 102 mmol/L (ref 96–106)
Creatinine, Ser: 1.42 mg/dL — ABNORMAL HIGH (ref 0.76–1.27)
GFR calc Af Amer: 65 (ref >59)
GFR calc non Af Amer: 56 — ABNORMAL LOW (ref >59)
Glucose: 107 mg/dL — ABNORMAL HIGH (ref 65–99)
Potassium: 4.2 mmol/L (ref 3.5–5.2)
Sodium: 143 mmol/L (ref 134–144)

## 2016-03-10 LAB — MAGNESIUM: MAGNESIUM: 2 mg/dL (ref 1.6–2.3)

## 2016-03-10 MED ORDER — ENALAPRIL MALEATE 10 MG PO TABS
20.0000 mg | ORAL_TABLET | Freq: Two times a day (BID) | ORAL | Status: DC
  Administered 2016-03-10 – 2016-03-13 (×6): 20 mg via ORAL
  Filled 2016-03-10 (×6): qty 2

## 2016-03-10 MED ORDER — DOFETILIDE 250 MCG PO CAPS
500.0000 ug | ORAL_CAPSULE | Freq: Two times a day (BID) | ORAL | Status: DC
  Administered 2016-03-10 – 2016-03-13 (×6): 500 ug via ORAL
  Filled 2016-03-10 (×6): qty 2

## 2016-03-10 MED ORDER — ONDANSETRON HCL 4 MG/2ML IJ SOLN: 4.0000 mg | Freq: Four times a day (QID) | INTRAMUSCULAR | Status: DC | PRN

## 2016-03-10 MED ORDER — ATORVASTATIN CALCIUM 20 MG PO TABS
20.0000 mg | ORAL_TABLET | Freq: Every day | ORAL | Status: DC
  Administered 2016-03-10 – 2016-03-12 (×3): 20 mg via ORAL
  Filled 2016-03-10 (×3): qty 1

## 2016-03-10 MED ORDER — APIXABAN 5 MG PO TABS: 5.0000 mg | ORAL_TABLET | Freq: Two times a day (BID) | ORAL | Status: DC

## 2016-03-10 MED ORDER — POTASSIUM CHLORIDE ER 10 MEQ PO TBCR
10.0000 meq | EXTENDED_RELEASE_TABLET | Freq: Every day | ORAL | Status: DC
  Filled 2016-03-10: qty 1

## 2016-03-10 MED ORDER — CARVEDILOL 12.5 MG PO TABS: 12.5000 mg | ORAL_TABLET | Freq: Two times a day (BID) | ORAL | Status: DC

## 2016-03-10 MED ORDER — CARVEDILOL 12.5 MG PO TABS
12.5000 mg | ORAL_TABLET | Freq: Two times a day (BID) | ORAL | Status: DC
  Administered 2016-03-10 – 2016-03-12 (×4): 12.5 mg via ORAL
  Filled 2016-03-10 (×4): qty 1

## 2016-03-10 MED ORDER — DILTIAZEM HCL ER COATED BEADS 240 MG PO CP24
240.0000 mg | ORAL_CAPSULE | Freq: Every day | ORAL | Status: DC
  Administered 2016-03-11 – 2016-03-13 (×3): 240 mg via ORAL
  Filled 2016-03-10 (×3): qty 1

## 2016-03-10 MED ORDER — ATORVASTATIN CALCIUM 20 MG PO TABS: 20.0000 mg | ORAL_TABLET | Freq: Every day | ORAL | Status: DC

## 2016-03-10 MED ORDER — APIXABAN 5 MG PO TABS
5.0000 mg | ORAL_TABLET | Freq: Two times a day (BID) | ORAL | Status: DC
Start: 2016-03-10 — End: 2016-03-13
  Administered 2016-03-11 – 2016-03-13 (×5): 5 mg via ORAL
  Filled 2016-03-10 (×6): qty 1

## 2016-03-10 MED ORDER — ACETAMINOPHEN 325 MG PO TABS: 650.0000 mg | ORAL_TABLET | ORAL | Status: DC | PRN

## 2016-03-10 MED ORDER — ACETAMINOPHEN 500 MG PO TABS: 500.0000 mg | ORAL_TABLET | Freq: Four times a day (QID) | ORAL | Status: DC | PRN

## 2016-03-10 NOTE — H&P (Signed)
Primary Care Physician: Maryellen Pile, MD Referring Physician: Dr. Lindell Spar is a 51 y.o. male with a h/o HTN,  afib/flutter that was first diagnosed in May of this year. He was started on Eliquis and cardioverted. He however had ERAF. Echo in May showed EF of 25-30%, with Left atrium of 50 mm. EF was changed from a previous echo showing EF of 70% from 2008.   He reports that he was treated at [facility] last winter/spring and lost around 50 lbs. He was given two vitamins and one prescription drug that he does not remember the name. He started feeling his heart race about this time. He recently had repeat echo that showed EF at 25-30% but now left atrium is 60 mm. He does not drink caffeine or alcohol. He is very active in his job and so far has tolerated arrhythmia. No edema or PND/orthopnea. Wife reports years of snoring and witnessed apnea spells and he has not had a sleep apnea test. Even though pt has rvr with MD visits, at home he states that his rates are controlled. Gets anxious at MD office.  Today, he denies symptoms of palpitations, chest pain, shortness of breath, orthopnea, PND, lower extremity edema, dizziness, presyncope, syncope, or neurologic sequela. The patient is tolerating medications without difficulties and is otherwise without complaint today.       Past Medical History:  Diagnosis Date  . Atrial fibrillation, persistent ([place])   . Atrial flutter ([place])   . Chronic renal insufficiency    Baseline Cr around 1.2, likely secondary to HTN  . Dilated cardiomyopathy ([place])    Hx of-resolves on echo 2008-EF 60-70%  . Hyperlipidemia   . Hypertension   . Left ventricular dysfunction    ejection fraction 25-30% by 2-D echo  . Nephrolithiasis 2012   Calcium oxalate stones        Past Surgical History:  Procedure Laterality Date  . CARDIOVERSION N/A 07/01/2015   Procedure: CARDIOVERSION;  Surgeon: Sanda Klein, MD;  Location: [facility]  ENDOSCOPY;  Service: Cardiovascular;  Laterality: N/A;  . TEE WITHOUT CARDIOVERSION N/A 07/01/2015   Procedure: TRANSESOPHAGEAL ECHOCARDIOGRAM (TEE);  Surgeon: Sanda Klein, MD;  Location: [facility] ENDOSCOPY;  Service: Cardiovascular;  Laterality: N/A;  . WISDOM TOOTH EXTRACTION            Current Outpatient Prescriptions  Medication Sig Dispense Refill  . atorvastatin (LIPITOR) 20 MG tablet TAKE 1 TABLET (20 MG TOTAL) BY MOUTH DAILY AT 6 PM. 90 tablet 2  . carvedilol (COREG) 25 MG tablet TAKE 1 TABLET (25 MG TOTAL) BY MOUTH 2 (TWO) TIMES DAILY WITH A MEAL. 180 tablet 3  . diltiazem (CARDIZEM CD) 240 MG 24 hr capsule TAKE ONE CAPSULE BY MOUTH EVERY DAY 30 capsule 11  . ELIQUIS 5 MG TABS tablet TAKE 1 TABLET (5 MG TOTAL) BY MOUTH 2 (TWO) TIMES DAILY. 60 tablet 5  . enalapril (VASOTEC) 20 MG tablet TAKE 1 TABLET BY MOUTH TWICE A DAY 180 tablet 1  . OVER THE COUNTER MEDICATION Place 1 drop into both eyes daily as needed (dry eyes). Over the counter lubricating eye drop    . potassium chloride (K-DUR) 10 MEQ tablet Take 1 tablet (10 mEq total) by mouth daily. 30 tablet 6   No current facility-administered medications for this encounter.          Allergies  Allergen Reactions  . Cephalexin Swelling  . Penicillins Swelling    Lip swelling Has patient had  a PCN reaction causing immediate rash, facial/tongue/throat swelling, SOB or lightheadedness with hypotension: Yes Has patient had a PCN reaction causing severe rash involving mucus membranes or skin necrosis: No Has patient had a PCN reaction that required hospitalization No Has patient had a PCN reaction occurring within the last 10 years: Yes If all of the above answers are "NO", then may proceed with Cephalosporin use.    Social History        Social History  . Marital status: Married    Spouse name: N/A  . Number of children: N/A  . Years of education: N/A      Occupational History  . Not on file.        Social History Main Topics  . Smoking status: Never Smoker  . Smokeless tobacco: Never Used  . Alcohol use No  . Drug use: No  . Sexual activity: Not on file       Other Topics Concern  . Not on file      Social History Narrative   Married, 2 daughters ages 75,20 (2012). Exercises regularly.         Family History  Problem Relation Age of Onset  . Prostate cancer Father   . Colon cancer Neg Hx     ROS- All systems are reviewed and negative except as per the HPI above  Physical Exam:    Vitals:   03/05/16 0937  BP: (!) 156/94  Pulse: (!) 139  Weight: 234 lb 3.2 oz (106.2 kg)  Height: 5\' 10"  (1.778 m)      Wt Readings from Last 3 Encounters:  03/05/16 234 lb 3.2 oz (106.2 kg)  01/08/16 236 lb 9.6 oz (107.3 kg)  12/25/15 234 lb 12.8 oz (106.5 kg)    Labs: Recent Labs       Lab Results  Component Value Date   NA 141 03/05/2016   K 3.9 03/05/2016   CL 106 03/05/2016   CO2 27 03/05/2016   GLUCOSE 108 (H) 03/05/2016   BUN 19 03/05/2016   CREATININE 1.55 (H) 03/05/2016   CALCIUM 9.0 03/05/2016   MG 2.1 03/05/2016     Recent Labs  No results found for: INR   Recent Labs       Lab Results  Component Value Date   CHOL 138 01/08/2016   HDL 44 01/08/2016   LDLCALC 83 01/08/2016   TRIG 57 01/08/2016       GEN- The patient is well appearing, alert and oriented x 3 today.   Head- normocephalic, atraumatic Eyes-  Sclera clear, conjunctiva pink Ears- hearing intact Oropharynx- clear Neck- supple, no JVP Lymph- no cervical lymphadenopathy Lungs- Clear to ausculation bilaterally, normal work of breathing Heart- Rapid irregular rate and rhythm, no murmurs, rubs or gallops, PMI not laterally displaced GI- soft, NT, ND, + BS Extremities- no clubbing, cyanosis, or edema MS- no significant deformity or atrophy Skin- no rash or lesion Psych- euthymic mood, full affect Neuro- strength and sensation are intact  EKG- Afib  with rvr at 139 bpm Echo-Study Conclusions  - Left ventricle: The cavity size was moderately dilated. Wall thickness was increased in a pattern of mild LVH. Systolic function was severely reduced. The estimated ejection fraction was in the range of 25% to 30%. Diffuse hypokinesis. - Aortic root: The aortic root was mildly dilated. - Mitral valve: There was mild regurgitation. - Left atrium: The atrium was severely dilated. - Right ventricle: The cavity size was mildly dilated. - Right atrium:  The atrium was mildly dilated. - Pulmonary arteries: PA peak pressure: 35 mm Hg (S).  Impressions:  - Severe global reduction in LV systolic function; mild LVH; mildly dilated aortic root; mild MR; severe LAE; mild RAE/RVE; trace TR with mildly elevated pulmonary pressure.    Assessment and Plan: 1. Persistent afib/flutter with RVR I feel that is is important to try to restore SR with LV dysfunction that is probably [facility] He will continue carvedilol 25 mg bid and cardizem 240 mg qd He is not  a candidate for multaq or flecainide due to LV dysfunction He is on the young side for amiodarone I discussed with Dr. Rayann Heman and it is felt that [name] is a good option He is willing to be admitted for initiation Wife will check on price of drug QTc is acceptable in SR at 417 ms Continue eliquis 5 mg without missed doses for CHA2DS2VASc score of at least two Creat cl cal at 83.70 No current meds that would prolong qtc Sleep study to be scheduled Bmet/mag today in anticipation of starting [place]. [facility] [address] [place], [place] [address] [phone]  EP Attending  Patient seen and examined. Agree with above. The patient is a pleasant 51 yo man with persistent atrial flutter who is being admitted for initiation of Tikosyn. Exam reveals a well appearing middle aged man, NAD, CV with IRIR rhythm and clear lungs. Extremity  without edema. ECG has been reviewed and QTC is acceptable. He will continue his current meds. His tikosyn/dofetilide dose will be 500 mcg q12. Will plan DCCV on Thursday if he has not converted back to NSR.   Mikle Bosworth.D.

## 2016-03-10 NOTE — Progress Notes (Signed)
Patient ID: Robert White                 DOB: 09-Mar-1965                    MRN: [id number]     HPI: Robert White is a 51 y.o. male patient of Dr. Gwenlyn Found who presents today for Tikosyn initiation. PMH includes HTN, afib/flutter that was first diagnosed in May of 2017. He was started on Eliquis and cardioverted. He returned to AF. His Echo in May showed EF 25-30%. At [facility] Less, NP most recent OV it was discussed that he is not  a candidate for multaq or flecainide due to LV dysfunction. He is on the young side for amiodarone and after discussion with [facility] it is felt that [name] is a good option. Patient is willing to be admitted for initiation.  He presents today and states that he is extremely nervous for this admission as he has been feeling so poorly. He was provided paperwork for patient assistance as requested, but he states he believes his income is above the criteria. Advised to call insurance to find out cost as he has not done this yet and bring forms with proof of income with him to hospital.   Pt educated on potential risks with Tikosyn including QTc prolongation. Pt is aware of the importance of not missing any doses and will call the office if they miss more than 2 doses in a row. Pt is anticoagulated with Eliquis and reports no missed doses within the past month. Pt is currently not taking any QTc prolongating or contraindicated medications.   EKG: Reviewed by Dr. Curt Bears. Abnormal EKG - Atrial Fibrillation with rapid ventricular response, minimal voltage criteria for LVH, may be normal variant, nonspecific ST and T wave abnormality, probably digitalis effect. Vent Rate 119 bpm, QTc 420ms.  Labs: Labs unfortunately not run as STAT due to [place] error - per [facility] should result soon.   -Update [facility] reports that will take 4 hour turnaround. At this point hospital bed pending and do not want to delay patient care due to lab mistake. Will plan to send patient and order stat  labs inpatient.    Past Medical History:  Diagnosis Date  . Atrial fibrillation, persistent ([place])   . Atrial flutter ([place])   . Chronic renal insufficiency    Baseline Cr around 1.2, likely secondary to HTN  . Dilated cardiomyopathy ([place])    Hx of-resolves on echo 2008-EF 60-70%  . Hyperlipidemia   . Hypertension   . Left ventricular dysfunction    ejection fraction 25-30% by 2-D echo  . Nephrolithiasis 2012   Calcium oxalate stones    Current Outpatient Prescriptions on File Prior to Visit  Medication Sig Dispense Refill  . atorvastatin (LIPITOR) 20 MG tablet TAKE 1 TABLET (20 MG TOTAL) BY MOUTH DAILY AT 6 PM. 90 tablet 2  . carvedilol (COREG) 25 MG tablet TAKE 1 TABLET (25 MG TOTAL) BY MOUTH 2 (TWO) TIMES DAILY WITH A MEAL. 180 tablet 3  . diltiazem (CARDIZEM CD) 240 MG 24 hr capsule TAKE ONE CAPSULE BY MOUTH EVERY DAY 30 capsule 11  . ELIQUIS 5 MG TABS tablet TAKE 1 TABLET (5 MG TOTAL) BY MOUTH 2 (TWO) TIMES DAILY. 60 tablet 5  . enalapril (VASOTEC) 20 MG tablet TAKE 1 TABLET BY MOUTH TWICE A DAY 180 tablet 1  . OVER THE COUNTER MEDICATION Place 1 drop into both eyes daily as  needed (dry eyes). Over the counter lubricating eye drop    . potassium chloride (K-DUR) 10 MEQ tablet Take 1 tablet (10 mEq total) by mouth daily. 30 tablet 6   No current facility-administered medications on file prior to visit.     Allergies  Allergen Reactions  . Cephalexin Swelling  . Penicillins Swelling    Lip swelling Has patient had a PCN reaction causing immediate rash, facial/tongue/throat swelling, SOB or lightheadedness with hypotension: Yes Has patient had a PCN reaction causing severe rash involving mucus membranes or skin necrosis: No Has patient had a PCN reaction that required hospitalization No Has patient had a PCN reaction occurring within the last 10 years: Yes If all of the above answers are "NO", then may proceed with Cephalosporin use.    Assessment/Plan:  1. Atrial  fibrillation - EKG reviewed and deemed appropriate by Dr. Curt Bears. Patient aware to proceed to inpatient admitting - Labs still not resulted due to error with [facility] sending to nonSTAT facility.   -Labs unfortunately not run as STAT due to [place] error - per [facility] should result soon.   -Update:  [facility] reports that will take 4 hour turnaround. At this point hospital bed pending and do not want to delay patient care due to lab mistake. Will plan to send patient and order stat labs inpatient.  -update potassium 4.2, Magnesium 2.0, SCr 1.42 (Crcl 36mL/min) anticipate starting dose of Tikosyn 549mcg BID.    Thank you, Lelan Pons. Patterson [place], [place]  [id number], [place], [place] [address]  Phone: [phone]; Fax: [phone] 03/10/2016 10:10 AM

## 2016-03-11 ENCOUNTER — Encounter (HOSPITAL_COMMUNITY): Payer: Self-pay

## 2016-03-11 DIAGNOSIS — I4891 Unspecified atrial fibrillation: Secondary | ICD-10-CM

## 2016-03-11 LAB — BASIC METABOLIC PANEL
ANION GAP: 10 (ref 5–15)
BUN: 18 mg/dL (ref 6–20)
CHLORIDE: 105 mmol/L (ref 101–111)
CO2: 24 mmol/L (ref 22–32)
Calcium: 8.5 mg/dL — ABNORMAL LOW (ref 8.9–10.3)
Creatinine, Ser: 1.45 mg/dL — ABNORMAL HIGH (ref 0.61–1.24)
GFR calc non Af Amer: 54 mL/min — ABNORMAL LOW (ref >60)
GLUCOSE: 87 mg/dL (ref 65–99)
Potassium: 3.4 mmol/L — ABNORMAL LOW (ref 3.5–5.1)
Sodium: 139 mmol/L (ref 135–145)

## 2016-03-11 LAB — MAGNESIUM
MAGNESIUM: 2.1 mg/dL (ref 1.7–2.4)
Magnesium: 1.8 mg/dL (ref 1.7–2.4)

## 2016-03-11 LAB — POTASSIUM: POTASSIUM: 4.3 mmol/L (ref 3.5–5.1)

## 2016-03-11 MED ORDER — MAGNESIUM SULFATE IN D5W 1-5 GM/100ML-% IV SOLN
1.0000 g | Freq: Once | INTRAVENOUS | Status: AC
  Administered 2016-03-11: 1 g via INTRAVENOUS
  Filled 2016-03-11: qty 100

## 2016-03-11 MED ORDER — POTASSIUM CHLORIDE CRYS ER 20 MEQ PO TBCR
40.0000 meq | EXTENDED_RELEASE_TABLET | Freq: Two times a day (BID) | ORAL | Status: AC
  Administered 2016-03-11 (×2): 40 meq via ORAL
  Filled 2016-03-11 (×2): qty 2

## 2016-03-11 NOTE — Progress Notes (Signed)
Per [employer] check for [employer]- Pt will need Prior Approval for either- brand or generic prior to discharge--   S/W  S[facility] @ [facility] # [phone]   1. TIKOSYN 500 MCG BID  NOT COVER  PRIOR APPROVAL- YES # [id number] FOR EXCEPTION    2. DOFETILIDE 500 MCG BID  NOT COVER  PRIOR APPROVAL- YES # [id number] FOR EXCEPTION   PHARMACY : [facility]

## 2016-03-11 NOTE — Progress Notes (Addendum)
Pharmacy Review for Dofetilide (Tikosyn) Initiation  Admit Complaint: 51 y.o. male admitted 03/10/2016 with atrial fibrillation to be initiated on dofetilide.   Assessment:  Patient Exclusion Criteria: If any screening criteria checked as "Yes", then  patient  should NOT receive dofetilide until criteria item is corrected. If "Yes" please indicate correction plan.  YES  NO Patient  Exclusion Criteria Correction Plan  []  []  Baseline QTc interval is greater than or equal to 440 msec. IF above YES box checked dofetilide contraindicated unless patient has ICD; then may proceed if QTc 500-550 msec or with known ventricular conduction abnormalities may proceed with QTc 550-600 msec. QTc =  466   []  [x]  Magnesium level is less than 1.8 mEq/l : Last magnesium:  Lab Results  Component Value Date   MG 1.8 03/11/2016         [x]  []  Potassium level is less than 4 mEq/l : Last potassium:  Lab Results  Component Value Date   K 3.4 (L) 03/11/2016       Oral potassium supplement has been scheduled twice daily  []  []  Patient is known or suspected to have a digoxin level greater than 2 ng/ml: No results found for: DIGOXIN    []  [x]  Creatinine clearance less than 20 ml/min (calculated using Cockcroft-Gault, actual body weight and serum creatinine): Estimated Creatinine Clearance: 72.8 mL/min (by C-G formula based on SCr of 1.45 mg/dL (H)).    []  [x]  Patient has received drugs known to prolong the QT intervals within the last 48 hours (phenothiazines, tricyclics or tetracyclic antidepressants, erythromycin, H-1 antihistamines, cisapride, fluoroquinolones, azithromycin). Drugs not listed above may have an, as yet, undetected potential to prolong the QT interval, updated information on QT prolonging agents is available at this website:QT prolonging agents   []  [x]  Patient received a dose of hydrochlorothiazide (Oretic) alone or in any combination including triamterene (Dyazide, Maxzide) in the last 48  hours.   []  [x]  Patient received a medication known to increase dofetilide plasma concentrations prior to initial dofetilide dose:  . Trimethoprim (Primsol, Proloprim) in the last 36 hours . Verapamil (Calan, Verelan) in the last 36 hours or a sustained release dose in the last 72 hours . Megestrol (Megace) in the last 5 days  . Cimetidine (Tagamet) in the last 6 hours . Ketoconazole (Nizoral) in the last 24 hours . Itraconazole (Sporanox) in the last 48 hours  . Prochlorperazine (Compazine) in the last 36 hours    []  [x]  Patient is known to have a history of torsades de pointes; congenital or acquired long QT syndromes.   []  [x]  Patient has received a Class 1 antiarrhythmic with less than 2 half-lives since last dose. (Disopyramide, Quinidine, Procainamide, Lidocaine, Mexiletine, Flecainide, Propafenone)   []  [x]  Patient has received amiodarone therapy in the past 3 months or amiodarone level is greater than 0.3 ng/ml.    Patient has been appropriately anticoagulated with Eliquis  Ordering provider was confirmed at [email or web address] if they are not listed on the [place] Prescribers list.  Goal of Therapy: Follow renal function, electrolytes, potential drug interactions, and dose adjustment. Provide education and 1 week supply at discharge.  Plan:  [x]   Physician selected initial dose within range recommended for patients level of renal function - will monitor for response.  []   Physician selected initial dose outside of range recommended for patients level of renal function - will discuss if the dose should be altered at this time.   Select One Calculated CrCl  Dose  q12h  [x]  > 60 ml/min 500 mcg  []  40-60 ml/min 250 mcg  []  20-40 ml/min 125 mcg   2. Follow up QTc after the first 5 doses, renal function, electrolytes (K & Mg) daily x 3 days, dose adjustment, success of initiation and facilitate 1 week discharge supply as clinically indicated.  3. Initiate Tikosyn  education video (Call [phone] and ask for video # 116).  4. Place Enrollment Form on the chart for discharge supply of dofetilide.  Georga Bora, PharmD Clinical Pharmacist Pager: [phone] 03/11/2016 12:11 PM

## 2016-03-11 NOTE — Care Management Note (Signed)
Case Management Note Marvetta Gibbons RN, BSN Unit 2W-Case Manager [phone]  Patient Details  Name: Robert White MRN: [id number] Date of Birth: 1965/10/03  Subjective/Objective:   Pt admitted with afib for Tikosyn load                 Action/Plan: PTA pt lived at home with spouse- plan to return home- referral received for Tikosyn needs- insurance check attempted - pt will need prior approval before insurance will give cost of drug- once approved pt will be able to use $4 copay assist card- pt to apply for card on line. Have spoken to pt at bedside regarding prior approval need- per pt he uses [facility] on [address] in [place]. Pt will need 7 day supply from [place] on discharge along with script sent to [facility] for them to order drug in stock.   Expected Discharge Date:     03/13/16             Expected Discharge Plan:  Home/Self Care  In-House Referral:     Discharge planning Services  CM Consult, Medication Assistance  Post Acute Care Choice:  NA Choice offered to:  NA  DME Arranged:    DME Agency:     HH Arranged:    HH Agency:     Status of Service:  Completed, signed off  If discussed at [employer] of Stay Meetings, dates discussed:    Additional Comments:  Dawayne Patricia, RN 03/11/2016, 3:14 PM

## 2016-03-11 NOTE — Progress Notes (Signed)
Pharmacy Review for Dofetilide (Tikosyn) Initiation  Admit Complaint: 51 y.o. male admitted 03/10/2016 with atrial fibrillation to be initiated on dofetilide.   Assessment:  Patient Exclusion Criteria: If any screening criteria checked as "Yes", then  patient  should NOT receive dofetilide until criteria item is corrected. If "Yes" please indicate correction plan.  YES  NO Patient  Exclusion Criteria Correction Plan  []  [x]  Baseline QTc interval is greater than or equal to 440 msec. IF above YES box checked dofetilide contraindicated unless patient has ICD; then may proceed if QTc 500-550 msec or with known ventricular conduction abnormalities may proceed with QTc 550-600 msec. QTc = 436   []  [x]  Magnesium level is less than 1.8 mEq/l : Last magnesium:  Lab Results  Component Value Date   MG 2.0 03/10/2016         [x]  []  Potassium level is less than 4 mEq/l : Last potassium:  Lab Results  Component Value Date   K 3.4 (L) 03/11/2016         []  [x]  Patient is known or suspected to have a digoxin level greater than 2 ng/ml: No results found for: DIGOXIN    []  [x]  Creatinine clearance less than 20 ml/min (calculated using Cockcroft-Gault, actual body weight and serum creatinine): Estimated Creatinine Clearance: 72.8 mL/min (by C-G formula based on SCr of 1.45 mg/dL (H)).    []  [x]  Patient has received drugs known to prolong the QT intervals within the last 48 hours (phenothiazines, tricyclics or tetracyclic antidepressants, erythromycin, H-1 antihistamines, cisapride, fluoroquinolones, azithromycin). Drugs not listed above may have an, as yet, undetected potential to prolong the QT interval, updated information on QT prolonging agents is available at this website:QT prolonging agents -Zofran d/c'd    []  [x]  Patient received a dose of hydrochlorothiazide (Oretic) alone or in any combination including triamterene (Dyazide, Maxzide) in the last 48 hours.   []  [x]  Patient received a  medication known to increase dofetilide plasma concentrations prior to initial dofetilide dose:  . Trimethoprim (Primsol, Proloprim) in the last 36 hours . Verapamil (Calan, Verelan) in the last 36 hours or a sustained release dose in the last 72 hours . Megestrol (Megace) in the last 5 days  . Cimetidine (Tagamet) in the last 6 hours . Ketoconazole (Nizoral) in the last 24 hours . Itraconazole (Sporanox) in the last 48 hours  . Prochlorperazine (Compazine) in the last 36 hours    []  [x]  Patient is known to have a history of torsades de pointes; congenital or acquired long QT syndromes.   []  [x]  Patient has received a Class 1 antiarrhythmic with less than 2 half-lives since last dose. (Disopyramide, Quinidine, Procainamide, Lidocaine, Mexiletine, Flecainide, Propafenone)   []  [x]  Patient has received amiodarone therapy in the past 3 months or amiodarone level is greater than 0.3 ng/ml.    Patient has been appropriately anticoagulated with Eliquis.  Ordering provider was confirmed at [email or web address] if they are not listed on the [facility] Prescribers list.  Goal of Therapy: Follow renal function, electrolytes, potential drug interactions, and dose adjustment. Provide education and 1 week supply at discharge.  Plan:  []   Physician selected initial dose within range recommended for patients level of renal function - will monitor for response.  []   Physician selected initial dose outside of range recommended for patients level of renal function - will discuss if the dose should be altered at this time.   Select One Calculated CrCl  Dose q12h  []  > 60  ml/min 500 mcg  []  40-60 ml/min 250 mcg  []  20-40 ml/min 125 mcg   2. Follow up QTc after the first 5 doses, renal function, electrolytes (K & Mg) daily x 3     days, dose adjustment, success of initiation and facilitate 1 week discharge supply as     clinically indicated.  3. Initiate Tikosyn education video (Call [phone] and ask  for Tikosyn Video # 116).  4. Place Enrollment Form on the chart for discharge supply of dofetilide.   Karinda Cabriales S. Alford Highland, PharmD, [place] Clinical Staff Pharmacist Pager [phone]  Eilene Ghazi Stillinger 7:03 AM 03/11/2016

## 2016-03-11 NOTE — Progress Notes (Signed)
03/10/2016 1700 Received pt to room 2W01 from home as a direct admit here for Tikosyn therapy.  Pt is A&O, no C/O voiced.  Tele monitor applied and CCMD notified.  Oriented to room, call light and bed.  Call bell in reach, family at bedside.  Will notify attending on pt's. arrival.   Carney Corners

## 2016-03-11 NOTE — Progress Notes (Signed)
    SUBJECTIVE: The patient is doing well today.  At this time, he denies chest pain, shortness of breath, or any new concerns.  [name] apixaban  5 mg Oral BID  . atorvastatin  20 mg Oral q1800  . carvedilol  12.5 mg Oral BID WC  . diltiazem  240 mg Oral Daily  . dofetilide  500 mcg Oral BID  . enalapril  20 mg Oral BID  . magnesium sulfate 1 - 4 g bolus IVPB  1 g Intravenous Once  . potassium chloride  40 mEq Oral BID     OBJECTIVE: Physical Exam: Vitals:   03/10/16 1707 03/10/16 2006 03/11/16 0300  BP: (!) 150/96 (!) 155/98 (!) 136/104  Pulse: (!) 118 97 79  Resp: 18 16 18   Temp: 98.4 F (36.9 C) 98.3 F (36.8 C) 97.5 F (36.4 C)  TempSrc: Oral Oral Oral  SpO2: 100% 100% 100%  Weight: 235 lb (106.6 kg)    Height: 5\' 10"  (1.778 m)      Intake/Output Summary (Last 24 hours) at 03/11/16 0948 Last data filed at 03/10/16 1800  Gross per 24 hour  Intake              240 ml  Output                0 ml  Net              240 ml    Telemetry is reviewed by myself, AFib, generally 80's-90's, infrequent PVC's note prior to start of drug  GEN- The patient is well appearing, alert and oriented x 3 today.   Head- normocephalic, atraumatic Eyes-  Sclera clear, conjunctiva pink Ears- hearing intact Oropharynx- clear Neck- supple, no JVP Lungs- CTA b/l, normal work of breathing Heart- IRRR, no significant murmurs, no rubs or gallops GI- soft, NT, ND Extremities- no clubbing, cyanosis, or edema Skin- no rash or lesion Psych- euthymic mood, full affect Neuro- no gross deficits appreciated  LABS: Basic Metabolic Panel:  Recent Labs  03/10/16 0940 03/11/16 0219 03/11/16 0908  NA 143 139  --   K 4.2 3.4*  --   CL 102 105  --   CO2 23 24  --   GLUCOSE 107* 87  --   BUN 20 18  --   CREATININE 1.42* 1.45*  --   CALCIUM 9.0 8.5*  --   MG 2.0  --  1.8      ASSESSMENT AND PLAN:   1. Persistent Afib, Tikosyn initiation     1 dose so far     CHA2DS2Vasc is 2, on  Eliquis     K+ 3.4,  replacement ordered     Mag 1.8, replacement ordered     Creat 1.45 (clac Cr. Cl = 90)     QTc/EKG is reviewed with Dr. Lovena Le, stable, OL to proceed     DCCV tomorrow if remains in AF, patient aware and agreeable  2. HTN     May need to titrate meds, follow today     Tommye Standard, Hershal Coria 03/11/2016 9:48 AM  EP Attending  Patient seen and examined. Agree with above. His QTC is ok and he is tolerating his dofetilide. Will plan to continue his tele, and dofetilide, and if he has not converted to NSR, will plan DCCV. Will plan to replace his potassium. Will uptitrate his coreg and or diltiazem if his blood pressure elevation persists.  Mikle Bosworth.D.

## 2016-03-12 ENCOUNTER — Other Ambulatory Visit: Payer: Self-pay | Admitting: Pharmacist Clinician (PhC)/ Clinical Pharmacy Specialist

## 2016-03-12 ENCOUNTER — Other Ambulatory Visit: Payer: Self-pay

## 2016-03-12 LAB — BASIC METABOLIC PANEL WITH GFR
Anion gap: 9 (ref 5–15)
BUN: 20 mg/dL (ref 6–20)
CO2: 23 mmol/L (ref 22–32)
Calcium: 8.5 mg/dL — ABNORMAL LOW (ref 8.9–10.3)
Chloride: 107 mmol/L (ref 101–111)
Creatinine, Ser: 1.32 mg/dL — ABNORMAL HIGH (ref 0.61–1.24)
GFR calc Af Amer: >60 mL/min
GFR calc non Af Amer: >60 mL/min
Glucose, Bld: 86 mg/dL (ref 65–99)
Potassium: 3.5 mmol/L (ref 3.5–5.1)
Sodium: 139 mmol/L (ref 135–145)

## 2016-03-12 LAB — MAGNESIUM: Magnesium: 2.1 mg/dL (ref 1.7–2.4)

## 2016-03-12 LAB — HIV ANTIBODY (ROUTINE TESTING W REFLEX): HIV SCREEN 4TH GENERATION: NONREACTIVE

## 2016-03-12 MED ORDER — POTASSIUM CHLORIDE CRYS ER 20 MEQ PO TBCR
40.0000 meq | EXTENDED_RELEASE_TABLET | Freq: Two times a day (BID) | ORAL | Status: DC
  Administered 2016-03-12: 40 meq via ORAL
  Filled 2016-03-12: qty 2

## 2016-03-12 MED ORDER — DILTIAZEM HCL ER COATED BEADS 240 MG PO CP24: 240.0000 mg | ORAL_CAPSULE | Freq: Every day | ORAL | 3 refills | Status: DC

## 2016-03-12 MED ORDER — APIXABAN 5 MG PO TABS: 5.0000 mg | ORAL_TABLET | Freq: Two times a day (BID) | ORAL | 1 refills | Status: DC

## 2016-03-12 MED ORDER — CARVEDILOL 6.25 MG PO TABS
18.7500 mg | ORAL_TABLET | Freq: Two times a day (BID) | ORAL | Status: DC
  Administered 2016-03-12 – 2016-03-13 (×2): 18.75 mg via ORAL
  Filled 2016-03-12 (×2): qty 1

## 2016-03-12 NOTE — Progress Notes (Signed)
Progress Note  Patient Name: Robert White Date of Encounter: 03/12/2016  Primary Cardiologist: new  Subjective   No chest pain or sob. No palpitations.  Inpatient Medications    Scheduled Meds: . apixaban  5 mg Oral BID  . atorvastatin  20 mg Oral q1800  . carvedilol  12.5 mg Oral BID WC  . diltiazem  240 mg Oral Daily  . dofetilide  500 mcg Oral BID  . enalapril  20 mg Oral BID  . potassium chloride  40 mEq Oral BID   Continuous Infusions:  PRN Meds: acetaminophen   Vital Signs    Vitals:   03/11/16 1725 03/11/16 2032 03/12/16 0530 03/12/16 0827  BP: (!) 152/106 (!) 153/99 (!) 146/86 (!) 146/92  Pulse:  72 70 68  Resp:   18 18  Temp:  98.3 F (36.8 C) 97.9 F (36.6 C) 98 F (36.7 C)  TempSrc:  Oral Oral Oral  SpO2:  100% 100% 100%  Weight:      Height:        Intake/Output Summary (Last 24 hours) at 03/12/16 [id number] Last data filed at 03/12/16 0800  Gross per 24 hour  Intake              720 ml  Output                0 ml  Net              720 ml   Filed Weights   03/10/16 1707  Weight: 235 lb (106.6 kg)    Telemetry    nsr - Personally Reviewed  ECG    nsr with QTC 470 - Personally Reviewed  Physical Exam   GEN: well appearing 51 yo man, No acute distress.   Neck: No JVD Cardiac: RRR, no murmurs, rubs, or gallops.  Respiratory: Clear to auscultation bilaterally. GI: Soft, nontender, non-distended  MS: No edema; No deformity. Neuro:  Nonfocal  Psych: Normal affect   Labs    Chemistry Recent Labs Lab 03/05/16 1022 03/10/16 0940 03/11/16 0219 03/11/16 1854 03/12/16 0249  NA 141 143 139  --  139  K 3.9 4.2 3.4* 4.3 3.5  CL 106 102 105  --  107  CO2 27 23 24   --  23  GLUCOSE 108* 107* 87  --  86  BUN 19 20 18   --  20  CREATININE 1.55* 1.42* 1.45*  --  1.32*  CALCIUM 9.0 9.0 8.5*  --  8.5*  GFRNONAA 50* 56* 54*  --  >60  GFRAA 58* 65 >60  --  >60  ANIONGAP 8  --  10  --  9     HematologyNo results for input(s): WBC, RBC,  HGB, HCT, MCV, MCH, MCHC, RDW, PLT in the last 168 hours.  Cardiac EnzymesNo results for input(s): TROPONINI in the last 168 hours. No results for input(s): TROPIPOC in the last 168 hours.   BNPNo results for input(s): BNP, PROBNP in the last 168 hours.   DDimer No results for input(s): DDIMER in the last 168 hours.   Radiology    No results found.  Cardiac Studies   none  Patient Profile     51 y.o. male admitted for Tikosyn initiation now returned to NSR.  Assessment & Plan    1. Atrial fib - he has reverted back to NSR on dofetilide. He will continue today with plans to DC home after tomorrow morning dose. 2. Hypokalemia - replete  3. HTN - his blood pressure remains up a bit. I will increase the coreg.  Robert White,M.D.  Signed, Robert Peru, MD  03/12/2016, 8:33 AM  Patient ID: Robert White, male   DOB: 1965/09/03, 51 y.o.   MRN: [id number]

## 2016-03-12 NOTE — Discharge Summary (Signed)
ELECTROPHYSIOLOGY PROCEDURE DISCHARGE SUMMARY    Patient ID: Robert White,  MRN: [id number], DOB/AGE: 53-05-1965 53 y.o.  Admit date: 03/10/2016 Discharge date: 03/13/2016  Primary Care Physician: Maryellen Pile, MD  Primary Cardiologist: Dr. Gwenlyn Found   Primary Discharge Diagnosis:  1. Persistent atrial fibrillation status post Tikosyn loading this admission  Secondary Discharge Diagnosis:  1. HTN 2. NICM 3. HLD  Allergies  Allergen Reactions  . Cephalexin Swelling  . Penicillins Swelling    Lip swelling Has patient had a PCN reaction causing immediate rash, facial/tongue/throat swelling, SOB or lightheadedness with hypotension: Yes Has patient had a PCN reaction causing severe rash involving mucus membranes or skin necrosis: No Has patient had a PCN reaction that required hospitalization No Has patient had a PCN reaction occurring within the last 10 years: Yes If all of the above answers are "NO", then may proceed with Cephalosporin use.     Procedures This Admission:  1.  Tikosyn loading  Brief HPI: Robert White is a 51 y.o. male with a past medical history as noted above.  They were referred to the AF clinic in the outpatient setting for treatment options of atrial fibrillation.  Risks, benefits, and alternatives to Tikosyn were reviewed with the patient who wished to proceed.    Hospital Course:  The patient was admitted and Tikosyn was initiated.  Renal function and electrolytes were followed during the hospitalization and replaced as needed.  His QTc remained stable.  He converted to SR without need for DCCV.  He was monitored until discharge on telemetry which demonstrated NSR.  On the day of discharge, the patient was examined by Dr Lovena Le who considered him stable for discharge to home.  We will continue Kdur 75meq BID and daily mag ox out patient.  His Coreg up-titrated given HTN as well, follow out patient.  Follow-up has been arranged with the AFib clinic for  labs/EKG and visit in 1 week and with Dr Lovena Le in 4 weeks.   Prior auth for Dofetilide 564mcg BID was obtained [id number] $0.00 co-pay, 100% coverage with prior-auth  Physical Exam: Vitals:   03/12/16 1700 03/12/16 2059 03/13/16 0625 03/13/16 0745  BP: (!) 152/94 (!) 155/96 (!) 148/97 (!) 153/100  Pulse: 67 64 67 68  Resp:  18 18 18   Temp:  98.2 F (36.8 C) 98.4 F (36.9 C) 98.1 F (36.7 C)  TempSrc:  Oral Oral Oral  SpO2:  100% 100% 100%  Weight:      Height:        GEN- The patient is well appearing, alert and oriented x 3 today.   HEENT: normocephalic, atraumatic; sclera clear, conjunctiva pink; hearing intact; oropharynx clear; neck supple, no JVP Lymph- no cervical lymphadenopathy Lungs-  CTA b/l, normal work of breathing.  No wheezes, rales, rhonchi Heart-  RRR, no murmurs, rubs or gallops, PMI not laterally displaced GI- soft, non-tender, non-distended Extremities- no clubbing, cyanosis, or edema MS- no significant deformity or atrophy Skin- warm and dry, no rash or lesion Psych- euthymic mood, full affect Neuro- strength and sensation are intact   Labs:   Lab Results  Component Value Date   WBC 7.9 06/18/2015   HGB 16.3 06/18/2015   HCT 50.5 (H) 06/18/2015   MCV 90.5 06/18/2015   PLT 340 06/18/2015     Recent Labs Lab 03/13/16 0341  NA 137  K 3.6  CL 106  CO2 25  BUN 17  CREATININE 1.28*  CALCIUM 8.6*  GLUCOSE  94     Discharge Medications:  Allergies as of 03/13/2016      Reactions   Cephalexin Swelling   Penicillins Swelling   Lip swelling Has patient had a PCN reaction causing immediate rash, facial/tongue/throat swelling, SOB or lightheadedness with hypotension: Yes Has patient had a PCN reaction causing severe rash involving mucus membranes or skin necrosis: No Has patient had a PCN reaction that required hospitalization No Has patient had a PCN reaction occurring within the last 10 years: Yes If all of the above answers are "NO",  then may proceed with Cephalosporin use.      Medication List    STOP taking these medications   potassium chloride 10 MEQ tablet Commonly known as:  K-DUR     TAKE these medications   acetaminophen 500 MG tablet Commonly known as:  TYLENOL Take 500 mg by mouth every 6 (six) hours as needed for mild pain.   apixaban 5 MG Tabs tablet Commonly known as:  ELIQUIS Take 1 tablet (5 mg total) by mouth 2 (two) times daily.   atorvastatin 20 MG tablet Commonly known as:  LIPITOR TAKE 1 TABLET (20 MG TOTAL) BY MOUTH DAILY AT 6 PM.   carvedilol 25 MG tablet Commonly known as:  COREG TAKE 1 TABLET (25 MG TOTAL) BY MOUTH 2 (TWO) TIMES DAILY WITH A MEAL.   diltiazem 240 MG 24 hr capsule Commonly known as:  CARDIZEM CD Take 1 capsule (240 mg total) by mouth daily. What changed:  See the new instructions.   dofetilide 500 MCG capsule Commonly known as:  TIKOSYN Take 1 capsule (500 mcg total) by mouth 2 (two) times daily.   enalapril 20 MG tablet Commonly known as:  VASOTEC TAKE 1 TABLET BY MOUTH TWICE A DAY   magnesium oxide 400 (241.3 Mg) MG tablet Commonly known as:  MAG-OX Take 1 tablet (400 mg total) by mouth daily. Start taking on:  03/14/2016   OVER THE COUNTER MEDICATION Place 1 drop into both eyes daily as needed (dry eyes). Over the counter lubricating eye drop   potassium chloride SA 20 MEQ tablet Commonly known as:  K-DUR,KLOR-CON Take 2 tablets (40 mEq total) by mouth 2 (two) times daily. Start taking on:  03/14/2016       Disposition:  Home Discharge Instructions    Diet - low sodium heart healthy    Complete by:  As directed    Increase activity slowly    Complete by:  As directed      Follow-up Information    [facility] Follow up on 03/19/2016.   Specialty:  Cardiology Why:  9:30AM Contact information: [address] [id number] [facility] [place]       Cristopher Peru, MD Follow up on  04/08/2016.   Specialty:  Cardiology Why:  12:15PM Contact information: [address] [address] [phone]           Duration of Discharge Encounter: Greater than 30 minutes including physician time.  Venetia Night, PA-C 03/13/2016 11:49 AM  EP Attending  Patient seen and examined. Agree with above. He is stable for DC home. Usual followup.  Mikle Bosworth.D.

## 2016-03-12 NOTE — Progress Notes (Signed)
Pharmacy Review for Dofetilide (Tikosyn) Initiation  Admit Complaint: 51 y.o. male admitted 03/10/2016 with atrial fibrillation to be initiated on dofetilide.   Assessment:  Patient Exclusion Criteria: If any screening criteria checked as "Yes", then  patient  should NOT receive dofetilide until criteria item is corrected. If "Yes" please indicate correction plan.  YES  NO Patient  Exclusion Criteria Correction Plan  []  [x]  Baseline QTc interval is greater than or equal to 440 msec. IF above YES box checked dofetilide contraindicated unless patient has ICD; then may proceed if QTc 500-550 msec or with known ventricular conduction abnormalities may proceed with QTc 550-600 msec. QTc = 0.44   []  [x]  Magnesium level is less than 1.8 mEq/l : Last magnesium:  Lab Results  Component Value Date   MG 2.1 03/12/2016         [x]  []  Potassium level is less than 4 mEq/l : Last potassium:  Lab Results  Component Value Date   K 3.5 03/12/2016       Being replace oraly  []  [x]  Patient is known or suspected to have a digoxin level greater than 2 ng/ml: No results found for: DIGOXIN    []  [x]  Creatinine clearance less than 20 ml/min (calculated using Cockcroft-Gault, actual body weight and serum creatinine): Estimated Creatinine Clearance: 80 mL/min (by C-G formula based on SCr of 1.32 mg/dL (H)).    []  [x]  Patient has received drugs known to prolong the QT intervals within the last 48 hours (phenothiazines, tricyclics or tetracyclic antidepressants, erythromycin, H-1 antihistamines, cisapride, fluoroquinolones, azithromycin). Drugs not listed above may have an, as yet, undetected potential to prolong the QT interval, updated information on QT prolonging agents is available at this website:QT prolonging agents   []  [x]  Patient received a dose of hydrochlorothiazide (Oretic) alone or in any combination including triamterene (Dyazide, Maxzide) in the last 48 hours.   []  [x]  Patient received a  medication known to increase dofetilide plasma concentrations prior to initial dofetilide dose:  . Trimethoprim (Primsol, Proloprim) in the last 36 hours . Verapamil (Calan, Verelan) in the last 36 hours or a sustained release dose in the last 72 hours . Megestrol (Megace) in the last 5 days  . Cimetidine (Tagamet) in the last 6 hours . Ketoconazole (Nizoral) in the last 24 hours . Itraconazole (Sporanox) in the last 48 hours  . Prochlorperazine (Compazine) in the last 36 hours    []  [x]  Patient is known to have a history of torsades de pointes; congenital or acquired long QT syndromes.   []  [x]  Patient has received a Class 1 antiarrhythmic with less than 2 half-lives since last dose. (Disopyramide, Quinidine, Procainamide, Lidocaine, Mexiletine, Flecainide, Propafenone)   []  [x]  Patient has received amiodarone therapy in the past 3 months or amiodarone level is greater than 0.3 ng/ml.    Patient has been appropriately anticoagulated with Eliquis.  Ordering provider was confirmed at [email or web address] if they are not listed on the [place] Prescribers list.  Goal of Therapy: Follow renal function, electrolytes, potential drug interactions, and dose adjustment. Provide education and 1 week supply at discharge.  Plan:  [x]   Physician selected initial dose within range recommended for patients level of renal function - will monitor for response.  []   Physician selected initial dose outside of range recommended for patients level of renal function - will discuss if the dose should be altered at this time.   Select One Calculated CrCl  Dose q12h  [x]  > 60 ml/min 500  mcg  []  40-60 ml/min 250 mcg  []  20-40 ml/min 125 mcg   2. Follow up QTc after the first 5 doses, renal function, electrolytes (K & Mg) daily x 3 days, dose adjustment, success of initiation and facilitate 1 week discharge supply as clinically indicated.  3. Initiate Tikosyn education video (Call [phone] and ask for  video # [phone]).  4. Place Enrollment Form on the chart for discharge supply of dofetilide.   Jodean Lima Donnie Gedeon 11:49 AM 03/12/2016

## 2016-03-13 ENCOUNTER — Encounter (HOSPITAL_COMMUNITY): Payer: Self-pay | Admitting: Nurse Practitioner

## 2016-03-13 ENCOUNTER — Other Ambulatory Visit: Payer: Self-pay | Admitting: *Deleted

## 2016-03-13 ENCOUNTER — Other Ambulatory Visit: Payer: Self-pay

## 2016-03-13 LAB — BASIC METABOLIC PANEL
Anion gap: 6 (ref 5–15)
BUN: 17 mg/dL (ref 6–20)
CHLORIDE: 106 mmol/L (ref 101–111)
CO2: 25 mmol/L (ref 22–32)
Calcium: 8.6 mg/dL — ABNORMAL LOW (ref 8.9–10.3)
Creatinine, Ser: 1.28 mg/dL — ABNORMAL HIGH (ref 0.61–1.24)
GFR calc Af Amer: >60 mL/min (ref >60)
GFR calc non Af Amer: >60 mL/min (ref >60)
GLUCOSE: 94 mg/dL (ref 65–99)
POTASSIUM: 3.6 mmol/L (ref 3.5–5.1)
Sodium: 137 mmol/L (ref 135–145)

## 2016-03-13 LAB — MAGNESIUM: MAGNESIUM: 1.8 mg/dL (ref 1.7–2.4)

## 2016-03-13 MED ORDER — MAGNESIUM OXIDE 400 (241.3 MG) MG PO TABS
400.0000 mg | ORAL_TABLET | Freq: Every day | ORAL | Status: DC
  Administered 2016-03-13: 400 mg via ORAL
  Filled 2016-03-13: qty 1

## 2016-03-13 MED ORDER — POTASSIUM CHLORIDE CRYS ER 20 MEQ PO TBCR
40.0000 meq | EXTENDED_RELEASE_TABLET | Freq: Two times a day (BID) | ORAL | Status: DC
  Administered 2016-03-13 (×2): 40 meq via ORAL
  Filled 2016-03-13 (×2): qty 2

## 2016-03-13 MED ORDER — DOFETILIDE 500 MCG PO CAPS: 500.0000 ug | ORAL_CAPSULE | Freq: Two times a day (BID) | ORAL | 3 refills | Status: DC

## 2016-03-13 MED ORDER — POTASSIUM CHLORIDE CRYS ER 20 MEQ PO TBCR
40.0000 meq | EXTENDED_RELEASE_TABLET | Freq: Two times a day (BID) | ORAL | 3 refills | Status: DC
Start: 2016-03-14 — End: 2016-07-03

## 2016-03-13 MED ORDER — MAGNESIUM OXIDE 400 (241.3 MG) MG PO TABS: 400.0000 mg | ORAL_TABLET | Freq: Every day | ORAL | 3 refills | Status: DC

## 2016-03-13 NOTE — Progress Notes (Addendum)
Patient in a stable condition, discharge education completed with patient and wife at bedside, they verbalised understanding, 7 days supply of tikosyn given to patient , patient belongings at bedside, iv removed, tele dc ccmd notified, patient taken off the unit on a wheelchair by a hospital volunteer, wife to drive patient home.

## 2016-03-13 NOTE — Care Management Note (Signed)
Case Management Note Robert Gibbons RN, BSN Unit 2W-Case Manager [phone]  Patient Details  Name: Robert White MRN: [id number] Date of Birth: 1965/10/27  Subjective/Objective:   Pt admitted with afib for Tikosyn load                 Action/Plan: PTA pt lived at home with spouse- plan to return home- referral received for Tikosyn needs- insurance check attempted - pt will need prior approval before insurance will give cost of drug- once approved pt will be able to use $4 copay assist card- pt to apply for card on line. Have spoken to pt at bedside regarding prior approval need- per pt [facility] uses [facility] [address] in [place]. Pt will need 7 day supply from [facility] on discharge along with script sent to [facility] for them to order drug in stock.   Expected Discharge Date:     03/13/16             Expected Discharge Plan:  Home/Self Care  In-House Referral:     Discharge planning Services  CM Consult, Medication Assistance  Post Acute Care Choice:  NA Choice offered to:  NA  DME Arranged:    DME Agency:     HH Arranged:    HH Agency:     Status of Service:  Completed, signed off  If discussed at [employer] of Stay Meetings, dates discussed:    Discharge Disposition: home/self care   Additional Comments:  03/13/16- 1020- Robert Maler RN, CM- pt for d/c home today- pre-auth has been done per PA-Rene U. Note-  Prior auth for Dofetilide 545mcg BID was obtained [id number] $0.00 co-pay, 100% coverage with prior-auth No further CM needs noted.   Dawayne Patricia, RN 03/13/2016, 10:21 AM

## 2016-03-13 NOTE — Discharge Instructions (Signed)
You have an appointment set up with the [facility].  Multiple studies have shown that being followed by a dedicated atrial fibrillation clinic in addition to the standard care you receive from your other physicians improves health. We believe that enrollment in the atrial fibrillation clinic will allow [place] to better care for you.   The phone number to the [facility] is [phone]. The clinic is staffed Monday through Friday from 8:30am to 5pm.  Parking Directions: The clinic is located in the Heart and Vascular Building connected to [facility]. 1)From [address] turn on to [address] and go to the 3rd entrance  (Heart and Vascular entrance) on the right. 2)Look to the right for Heart &Vascular Parking Garage. 3) the code for the entrance is 5002 4)Take the elevators to the 1st floor. Registration is in the room with the glass walls at the end of the hallway.  If you have any trouble parking or locating the clinic, please dont hesitate to call [phone].

## 2016-03-14 MED ORDER — ENALAPRIL MALEATE 20 MG PO TABS: 20.0000 mg | ORAL_TABLET | Freq: Two times a day (BID) | ORAL | 2 refills | Status: DC

## 2016-03-14 MED ORDER — ATORVASTATIN CALCIUM 20 MG PO TABS: ORAL_TABLET | ORAL | 2 refills | Status: DC

## 2016-03-19 ENCOUNTER — Encounter (HOSPITAL_COMMUNITY): Payer: Self-pay | Admitting: Nurse Practitioner

## 2016-03-19 ENCOUNTER — Ambulatory Visit (HOSPITAL_COMMUNITY)
Admit: 2016-03-19 | Discharge: 2016-03-19 | Disposition: A | Discharge status: Home / Self Care | Payer: 59 | Attending: Nurse Practitioner | Admitting: Nurse Practitioner

## 2016-03-19 VITALS — BP 162/94 | HR 81 | Ht 70.0 in | Wt 230.6 lb

## 2016-03-19 DIAGNOSIS — I129 Hypertensive chronic kidney disease with stage 1 through stage 4 chronic kidney disease, or unspecified chronic kidney disease: Secondary | ICD-10-CM | POA: Insufficient documentation

## 2016-03-19 DIAGNOSIS — I4892 Unspecified atrial flutter: Secondary | ICD-10-CM | POA: Insufficient documentation

## 2016-03-19 DIAGNOSIS — I481 Persistent atrial fibrillation: Secondary | ICD-10-CM | POA: Insufficient documentation

## 2016-03-19 DIAGNOSIS — Z7901 Long term (current) use of anticoagulants: Secondary | ICD-10-CM | POA: Insufficient documentation

## 2016-03-19 DIAGNOSIS — Z79899 Other long term (current) drug therapy: Secondary | ICD-10-CM | POA: Diagnosis not present

## 2016-03-19 DIAGNOSIS — Z8042 Family history of malignant neoplasm of prostate: Secondary | ICD-10-CM | POA: Insufficient documentation

## 2016-03-19 DIAGNOSIS — E785 Hyperlipidemia, unspecified: Secondary | ICD-10-CM | POA: Diagnosis not present

## 2016-03-19 DIAGNOSIS — Z800 Family history of malignant neoplasm of digestive organs: Secondary | ICD-10-CM | POA: Insufficient documentation

## 2016-03-19 DIAGNOSIS — I493 Ventricular premature depolarization: Secondary | ICD-10-CM | POA: Insufficient documentation

## 2016-03-19 DIAGNOSIS — Z9889 Other specified postprocedural states: Secondary | ICD-10-CM | POA: Diagnosis not present

## 2016-03-19 DIAGNOSIS — Z880 Allergy status to penicillin: Secondary | ICD-10-CM | POA: Diagnosis not present

## 2016-03-19 DIAGNOSIS — R9431 Abnormal electrocardiogram [ECG] [EKG]: Secondary | ICD-10-CM | POA: Diagnosis not present

## 2016-03-19 DIAGNOSIS — I420 Dilated cardiomyopathy: Secondary | ICD-10-CM | POA: Diagnosis not present

## 2016-03-19 DIAGNOSIS — I4819 Other persistent atrial fibrillation: Secondary | ICD-10-CM

## 2016-03-19 NOTE — Progress Notes (Signed)
Primary Care Physician: Maryellen Pile, MD Referring Physician: Dr. Lindell Spar is a 51 y.o. male with a h/o HTN,  afib/flutter that was first diagnosed in May of this year. He was started on Eliquis and cardioverted. He however had ERAF. Echo in May showed EF of 25-30%, with Left atrium of 50 mm. EF was changed from a previous echo showing EF of 70% from 2008.   He reports that he was treated at [facility] last winter/spring and lost around 50 lbs. He was given two vitamins and one prescription drug that he does not remember the name. He started feeling his heart race about this time. He recently had repeat echo that showed EF at 25-30% but now left atrium is 60 mm. He does not drink caffeine or alcohol. He is very active in his job and so far has tolerated arrhythmia. No edema or PND/orthopnea. Wife reports years of snoring and witnessed apnea spells and he has not had a sleep apnea test. Even though pt has rvr with MD visits, at home he states that his rates are controlled. Gets anxious at MD office.  F/u afib clinic for initiation of Tikosyn 2/20-2/23. He went into SR with drug and did not require cardioversion. He is taking correctly on d/c and is aware of Tikosyn precautions. He did not feel that he felt poorly in afib but now see that he has more energy and is sleeping much better in SR. Qtc is stable today.  Today, he denies symptoms of palpitations, chest pain, shortness of breath, orthopnea, PND, lower extremity edema, dizziness, presyncope, syncope, or neurologic sequela. The patient is tolerating medications without difficulties and is otherwise without complaint today.   Past Medical History:  Diagnosis Date  . Atrial fibrillation, persistent ([place])   . Atrial flutter ([place])   . Chronic renal insufficiency    Baseline Cr around 1.2, likely secondary to HTN  . Dilated cardiomyopathy ([place])    Hx of-resolves on echo 2008-EF 60-70%  . Hyperlipidemia   .  Hypertension   . Left ventricular dysfunction    ejection fraction 25-30% by 2-D echo  . Nephrolithiasis 2012   Calcium oxalate stones   Past Surgical History:  Procedure Laterality Date  . CARDIOVERSION N/A 07/01/2015   Procedure: CARDIOVERSION;  Surgeon: Sanda Klein, MD;  Location: [facility] ENDOSCOPY;  Service: Cardiovascular;  Laterality: N/A;  . TEE WITHOUT CARDIOVERSION N/A 07/01/2015   Procedure: TRANSESOPHAGEAL ECHOCARDIOGRAM (TEE);  Surgeon: Sanda Klein, MD;  Location: [facility] ENDOSCOPY;  Service: Cardiovascular;  Laterality: N/A;  . WISDOM TOOTH EXTRACTION      Current Outpatient Prescriptions  Medication Sig Dispense Refill  . acetaminophen (TYLENOL) 500 MG tablet Take 500 mg by mouth every 6 (six) hours as needed for mild pain.    [name] apixaban (ELIQUIS) 5 MG TABS tablet Take 1 tablet (5 mg total) by mouth 2 (two) times daily. 180 tablet 1  . atorvastatin (LIPITOR) 20 MG tablet TAKE 1 TABLET (20 MG TOTAL) BY MOUTH DAILY AT 6 PM. 90 tablet 2  . carvedilol (COREG) 25 MG tablet TAKE 1 TABLET (25 MG TOTAL) BY MOUTH 2 (TWO) TIMES DAILY WITH A MEAL. 180 tablet 3  . diltiazem (CARDIZEM CD) 240 MG 24 hr capsule Take 1 capsule (240 mg total) by mouth daily. 90 capsule 3  . dofetilide (TIKOSYN) 500 MCG capsule Take 1 capsule (500 mcg total) by mouth 2 (two) times daily. 60 capsule 3  . enalapril (VASOTEC) 20 MG  tablet Take 1 tablet (20 mg total) by mouth 2 (two) times daily. 180 tablet 2  . magnesium oxide (MAG-OX) 400 (241.3 Mg) MG tablet Take 1 tablet (400 mg total) by mouth daily. 30 tablet 3  . OVER THE COUNTER MEDICATION Place 1 drop into both eyes daily as needed (dry eyes). Over the counter lubricating eye drop    . potassium chloride SA (K-DUR,KLOR-CON) 20 MEQ tablet Take 2 tablets (40 mEq total) by mouth 2 (two) times daily. 60 tablet 3   No current facility-administered medications for this encounter.     Allergies  Allergen Reactions  . Cephalexin Swelling  . Penicillins Swelling      Lip swelling Has patient had a PCN reaction causing immediate rash, facial/tongue/throat swelling, SOB or lightheadedness with hypotension: Yes Has patient had a PCN reaction causing severe rash involving mucus membranes or skin necrosis: No Has patient had a PCN reaction that required hospitalization No Has patient had a PCN reaction occurring within the last 10 years: Yes If all of the above answers are "NO", then may proceed with Cephalosporin use.    Social History   Social History  . Marital status: Married    Spouse name: N/A  . Number of children: N/A  . Years of education: N/A   Occupational History  . Not on file.   Social History Main Topics  . Smoking status: Never Smoker  . Smokeless tobacco: Never Used  . Alcohol use No  . Drug use: No  . Sexual activity: Not on file   Other Topics Concern  . Not on file   Social History Narrative   Married, 2 daughters ages 64,20 (2012). Exercises regularly.    Family History  Problem Relation Age of Onset  . Prostate cancer Father   . Colon cancer Neg Hx     ROS- All systems are reviewed and negative except as per the HPI above  Physical Exam: Vitals:   03/19/16 0929  BP: (!) 162/94  Pulse: 81  Weight: 230 lb 9.6 oz (104.6 kg)  Height: 5\' 10"  (1.778 m)   Wt Readings from Last 3 Encounters:  03/19/16 230 lb 9.6 oz (104.6 kg)  03/10/16 235 lb (106.6 kg)  03/10/16 234 lb 8 oz (106.4 kg)    Labs: Lab Results  Component Value Date   NA 137 03/13/2016   K 3.6 03/13/2016   CL 106 03/13/2016   CO2 25 03/13/2016   GLUCOSE 94 03/13/2016   BUN 17 03/13/2016   CREATININE 1.28 (H) 03/13/2016   CALCIUM 8.6 (L) 03/13/2016   MG 1.8 03/13/2016   No results found for: INR Lab Results  Component Value Date   CHOL 138 01/08/2016   HDL 44 01/08/2016   LDLCALC 83 01/08/2016   TRIG 57 01/08/2016     GEN- The patient is well appearing, alert and oriented x 3 today.   Head- normocephalic, atraumatic Eyes-   Sclera clear, conjunctiva pink Ears- hearing intact Oropharynx- clear Neck- supple, no JVP Lymph- no cervical lymphadenopathy Lungs- Clear to ausculation bilaterally, normal work of breathing Heart-regular rate and rhythm, no murmurs, rubs or gallops, PMI not laterally displaced GI- soft, NT, ND, + BS Extremities- no clubbing, cyanosis, or edema MS- no significant deformity or atrophy Skin- no rash or lesion Psych- euthymic mood, full affect Neuro- strength and sensation are intact  EKG- Sinus brady at 81 bpm, pr int 162 ms, qrs int 92 ,s qtc 460 ms(stable) Echo-Study Conclusions  - Left ventricle:  The cavity size was moderately dilated. Wall   thickness was increased in a pattern of mild LVH. Systolic   function was severely reduced. The estimated ejection fraction   was in the range of 25% to 30%. Diffuse hypokinesis. - Aortic root: The aortic root was mildly dilated. - Mitral valve: There was mild regurgitation. - Left atrium: The atrium was severely dilated. - Right ventricle: The cavity size was mildly dilated. - Right atrium: The atrium was mildly dilated. - Pulmonary arteries: PA peak pressure: 35 mm Hg (S).  Impressions:  - Severe global reduction in LV systolic function; mild LVH; mildly   dilated aortic root; mild MR; severe LAE; mild RAE/RVE; trace TR   with mildly elevated pulmonary pressure.    Assessment and Plan: 1. Persistent afib/flutter with RVR Has returned to [place] at 500 mg bid Precautions with drug discussed Continue apixaban 5 mg bid Will need f/u echo  after maintaining SR for several months to evaluate for return of normal LV function Bmet/mag today  F/u with Dr. Lovena Le 3/21 afib clinc as needed  Geroge Baseman. [place], [facility] [address] [place], [place] [address] [phone]

## 2016-03-27 ENCOUNTER — Other Ambulatory Visit: Payer: Self-pay | Admitting: Cardiovascular Disease

## 2016-03-30 ENCOUNTER — Other Ambulatory Visit: Payer: Self-pay | Admitting: Internal Medicine

## 2016-04-08 ENCOUNTER — Ambulatory Visit (INDEPENDENT_AMBULATORY_CARE_PROVIDER_SITE_OTHER): Payer: 59 | Admitting: Internal Medicine

## 2016-04-08 ENCOUNTER — Encounter: Payer: Self-pay | Admitting: Internal Medicine

## 2016-04-08 VITALS — BP 138/100 | HR 80 | Ht 70.0 in | Wt 233.8 lb

## 2016-04-08 DIAGNOSIS — I10 Essential (primary) hypertension: Secondary | ICD-10-CM

## 2016-04-08 DIAGNOSIS — I4819 Other persistent atrial fibrillation: Secondary | ICD-10-CM

## 2016-04-08 DIAGNOSIS — I481 Persistent atrial fibrillation: Secondary | ICD-10-CM

## 2016-04-08 MED ORDER — CARVEDILOL 25 MG PO TABS: 37.5000 mg | ORAL_TABLET | Freq: Two times a day (BID) | ORAL | 3 refills | Status: DC

## 2016-04-08 NOTE — Patient Instructions (Addendum)
Medication Instructions:  INCREASE carvedilol (Coreg) take 1.5 tablets (37.5 mg) twice a day.    Labwork: None ordered  Testing/Procedures: None ordered  Follow-Up: Your physician wants you to follow-up in: 6 months with Dr. Lovena Le. You will receive a reminder letter in the mail two months in advance. If you don't receive a letter, please call our office to schedule the follow-up appointment.   Any Other Special Instructions Will Be Listed Below (If Applicable).     If you need a refill on your cardiac medications before your next appointment, please call your pharmacy.

## 2016-04-08 NOTE — Progress Notes (Signed)
HPI Robert White returns today for followup. He is a pleasant 51 yo man with HTN and persistent atrial fibrillation who was admitted for Tikosyn and reverted to NSR. He has done well in the interim. No chest pain or sob. His blood pressure remains a little high but no palpitations or chest pain. He denies peripheral edema. Allergies  Allergen Reactions  . Cephalexin Swelling  . Penicillins Swelling    Lip swelling Has patient had a PCN reaction causing immediate rash, facial/tongue/throat swelling, SOB or lightheadedness with hypotension: Yes Has patient had a PCN reaction causing severe rash involving mucus membranes or skin necrosis: No Has patient had a PCN reaction that required hospitalization No Has patient had a PCN reaction occurring within the last 10 years: Yes If all of the above answers are "NO", then may proceed with Cephalosporin use.     Current Outpatient Prescriptions  Medication Sig Dispense Refill  . acetaminophen (TYLENOL) 500 MG tablet Take 500 mg by mouth every 6 (six) hours as needed for mild pain.    [name] atorvastatin (LIPITOR) 20 MG tablet TAKE 1 TABLET (20 MG TOTAL) BY MOUTH DAILY AT 6 PM. 90 tablet 2  . carvedilol (COREG) 25 MG tablet TAKE 1 TABLET (25 MG TOTAL) BY MOUTH 2 (TWO) TIMES DAILY WITH A MEAL. 180 tablet 3  . diltiazem (CARDIZEM CD) 240 MG 24 hr capsule Take 1 capsule (240 mg total) by mouth daily. 90 capsule 3  . dofetilide (TIKOSYN) 500 MCG capsule Take 1 capsule (500 mcg total) by mouth 2 (two) times daily. 60 capsule 3  . ELIQUIS 5 MG TABS tablet TAKE 1 TABLET (5 MG TOTAL) BY MOUTH 2 (TWO) TIMES DAILY. 180 tablet 1  . enalapril (VASOTEC) 20 MG tablet Take 1 tablet (20 mg total) by mouth 2 (two) times daily. 180 tablet 2  . magnesium oxide (MAG-OX) 400 (241.3 Mg) MG tablet Take 1 tablet (400 mg total) by mouth daily. 30 tablet 3  . OVER THE COUNTER MEDICATION Place 1 drop into both eyes daily as needed (dry eyes). Over the counter lubricating eye  drop    . potassium chloride SA (K-DUR,KLOR-CON) 20 MEQ tablet Take 2 tablets (40 mEq total) by mouth 2 (two) times daily. 60 tablet 3   No current facility-administered medications for this visit.      Past Medical History:  Diagnosis Date  . Atrial fibrillation, persistent ([place])   . Atrial flutter ([place])   . Chronic renal insufficiency    Baseline Cr around 1.2, likely secondary to HTN  . Dilated cardiomyopathy ([place])    Hx of-resolves on echo 2008-EF 60-70%  . Hyperlipidemia   . Hypertension   . Left ventricular dysfunction    ejection fraction 25-30% by 2-D echo  . Nephrolithiasis 2012   Calcium oxalate stones    ROS:   All systems reviewed and negative except as noted in the HPI.   Past Surgical History:  Procedure Laterality Date  . CARDIOVERSION N/A 07/01/2015   Procedure: CARDIOVERSION;  Surgeon: Sanda Klein, MD;  Location: [facility] ENDOSCOPY;  Service: Cardiovascular;  Laterality: N/A;  . TEE WITHOUT CARDIOVERSION N/A 07/01/2015   Procedure: TRANSESOPHAGEAL ECHOCARDIOGRAM (TEE);  Surgeon: Sanda Klein, MD;  Location[facility] ENDOSCOPY;  Service: Cardiovascular;  Laterality: N/A;  . WISDOM TOOTH EXTRACTION       Family History  Problem Relation Age of Onset  . Prostate cancer Father   . Colon cancer Neg Hx      Social  History   Social History  . Marital status: Married    Spouse name: N/A  . Number of children: N/A  . Years of education: N/A   Occupational History  . Not on file.   Social History Main Topics  . Smoking status: Never Smoker  . Smokeless tobacco: Never Used  . Alcohol use No  . Drug use: No  . Sexual activity: Not on file   Other Topics Concern  . Not on file   Social History Narrative   Married, 2 daughters ages 56,20 (2012). Exercises regularly.     BP (!) 138/100   Pulse 80   Ht 5\' 10"  (1.778 m)   Wt 233 lb 12.8 oz (106.1 kg)   SpO2 98%   BMI 33.55 kg/m   Physical Exam:  Well appearing 52 yo man, NAD HEENT:  Unremarkable Neck:  6 cm JVD, no thyromegally Lymphatics:  No adenopathy Back:  No CVA tenderness Lungs:  Clear with no wheezes HEART:  Regular rate rhythm, no murmurs, no rubs, no clicks Abd:  soft, positive bowel sounds, no organomegally, no rebound, no guarding Ext:  2 plus pulses, no edema, no cyanosis, no clubbing Skin:  No rashes no nodules Neuro:  CN II through XII intact, motor grossly intact  EKG - nsr with a QTC of 465  Assess/Plan: 1. PAF - he is maintaining NSR very nicely. He will continue Tikosyn. His QT is ok. 2. HTN - his blood pressure is still high. His diastolics at home are in the 90-100 range. I have asked him to reduce his salt intake and to increase the coreg to 37.5 twice daily. 3. Chest pain - he has had no recently. Will follow. 4. Chronic systolic heart failure - he is now class 1. I will ask him to repeat his 2D echo in several months. If his EF does not improve, would consider ICD insertion.  Robert White.D.

## 2016-04-21 ENCOUNTER — Encounter (HOSPITAL_BASED_OUTPATIENT_CLINIC_OR_DEPARTMENT_OTHER): Payer: 59

## 2016-05-04 ENCOUNTER — Other Ambulatory Visit: Payer: Self-pay

## 2016-05-04 MED ORDER — MAGNESIUM OXIDE 400 (241.3 MG) MG PO TABS: 400.0000 mg | ORAL_TABLET | Freq: Every day | ORAL | 2 refills | Status: DC

## 2016-05-04 NOTE — Telephone Encounter (Signed)
Rx(s) sent to pharmacy electronically.  

## 2016-06-07 ENCOUNTER — Encounter (HOSPITAL_BASED_OUTPATIENT_CLINIC_OR_DEPARTMENT_OTHER): Payer: 59

## 2016-07-03 ENCOUNTER — Other Ambulatory Visit: Payer: Self-pay | Admitting: Physician Assistant

## 2016-07-06 ENCOUNTER — Other Ambulatory Visit: Payer: Self-pay | Admitting: Physician Assistant

## 2016-08-09 ENCOUNTER — Other Ambulatory Visit: Payer: Self-pay | Admitting: Internal Medicine

## 2016-08-19 ENCOUNTER — Other Ambulatory Visit: Payer: Self-pay | Admitting: *Deleted

## 2016-08-26 MED ORDER — ENALAPRIL MALEATE 20 MG PO TABS: 20.0000 mg | ORAL_TABLET | Freq: Two times a day (BID) | ORAL | 2 refills | Status: DC

## 2016-08-31 ENCOUNTER — Ambulatory Visit: Payer: 59

## 2016-08-31 ENCOUNTER — Ambulatory Visit (INDEPENDENT_AMBULATORY_CARE_PROVIDER_SITE_OTHER): Payer: 59 | Admitting: Internal Medicine

## 2016-08-31 ENCOUNTER — Encounter: Payer: Self-pay | Admitting: Internal Medicine

## 2016-08-31 VITALS — BP 146/90 | HR 71 | Temp 98.1°F | Ht 70.0 in | Wt 240.3 lb

## 2016-08-31 DIAGNOSIS — Z79899 Other long term (current) drug therapy: Secondary | ICD-10-CM | POA: Diagnosis not present

## 2016-08-31 DIAGNOSIS — I10 Essential (primary) hypertension: Secondary | ICD-10-CM | POA: Diagnosis not present

## 2016-08-31 DIAGNOSIS — E785 Hyperlipidemia, unspecified: Secondary | ICD-10-CM

## 2016-08-31 DIAGNOSIS — R21 Rash and other nonspecific skin eruption: Secondary | ICD-10-CM

## 2016-08-31 DIAGNOSIS — L732 Hidradenitis suppurativa: Secondary | ICD-10-CM

## 2016-08-31 MED ORDER — ENALAPRIL MALEATE 20 MG PO TABS: 20.0000 mg | ORAL_TABLET | Freq: Two times a day (BID) | ORAL | 10 refills | Status: DC

## 2016-08-31 MED ORDER — ATORVASTATIN CALCIUM 20 MG PO TABS: ORAL_TABLET | ORAL | 10 refills | Status: DC

## 2016-08-31 NOTE — Progress Notes (Signed)
   CC: Rash and HTN follow up   HPI:  [name] is a 51 y.o. male with PMH as described below who presents to clinic for new axillary rash and HTN follow up. Please see problem based assessment and plan for further details.   Past Medical History:  Diagnosis Date  . Atrial fibrillation, persistent ([place])   . Atrial flutter ([place])   . Chronic renal insufficiency    Baseline Cr around 1.2, likely secondary to HTN  . Dilated cardiomyopathy ([place])    Hx of-resolves on echo 2008-EF 60-70%  . Hyperlipidemia   . Hypertension   . Left ventricular dysfunction    ejection fraction 25-30% by 2-D echo  . Nephrolithiasis 2012   Calcium oxalate stones   Review of Systems:   Review of Systems  Constitutional: Negative for chills and fever.  Respiratory: Negative for cough and shortness of breath.   Cardiovascular: Negative for chest pain, palpitations and leg swelling.  Gastrointestinal: Negative for abdominal pain, constipation, diarrhea, nausea and vomiting.  Skin: Positive for rash.    Physical Exam:  Vitals:   08/31/16 0853  BP: (!) 146/90  Pulse: 71  Temp: 98.1 F (36.7 C)  TempSrc: Oral  SpO2: 100%  Weight: 240 lb 4.8 oz (109 kg)  Height: 5\' 10"  (1.778 m)   General: very pleasant male, well-developed, sitting up in chair in no acute distress  HENT: NCAT, neck supple and FROM, OP clear without exudates or erythema  Eyes: anicteric sclera, PERRL Cardiac: regular rate and rhythm, nl S1/S2, no murmurs, rubs or gallops, no JVD noted  Pulm: CTAB, no wheezes or crackles, no increased work of breathing  Abd: soft, NTND, bowel sounds present   Ext: warm and well perfused, no peripheral edema, 2+ DP pulses bilaterally  Derm: multiple nodular lesions in both axilla with no associated erythema, warmth, or discharge. No sinus tracts, comedones, or scarring noted. He also has 3 similar lesions in his R groin.   Assessment & Plan:   See Encounters Tab for problem based  charting.  Patient seen with Dr. Lynnae January

## 2016-08-31 NOTE — Assessment & Plan Note (Signed)
BP 146/90 today. Patient reports his BP tends to be high when at his doctor's appt because he gets anxious. States BP at goal at home. He brought pictures of his home BP readings, which were 120s/40-50s.   - No adjustments to BP meds today given BP at goal at home - Continue enalapril 20mg  BID, coreg 37.5mg  BID, and cardizem 240mg  QD

## 2016-08-31 NOTE — Assessment & Plan Note (Signed)
Refilled atorvastatin 20mg  QD

## 2016-08-31 NOTE — Progress Notes (Signed)
Internal Medicine Clinic Attending  I saw and evaluated the patient.  I personally confirmed the key portions of the history and exam documented by Dr. Santos-Sanchez and I reviewed pertinent patient test results.  The assessment, diagnosis, and plan were formulated together and I agree with the documentation in the resident's note. 

## 2016-08-31 NOTE — Assessment & Plan Note (Signed)
Patient presented with bilateral axillary rash of 2 weeks duration associated with mild tenderness and itching. Patient concern about allergic reaction as he switched soap and deodorant 2 weeks ago. He has been using aloe vera wipes with marked improvement in symptoms. States he had a similar rash 2-3 years ago for which he was given an antibiotic at an urgent care. He is unable to recall name of antibiotic. He also reports his daughter gets this rash as well and it resolves spontaneously. On exam, he has multiple nodular lesions in both axilla with no associated erythema, warmth, or discharge. No sinus tracts, comedones, or scarring noted. He also has 3 similar lesions in his R groin. This is very likely due to hidradenitis suppurativa vs allergic reaction given family history and presence of multiple nodules in intertriginous areas.   - Conservative management at this time given symptoms seem to be resolving.  - Patient instructed to call if symptoms worsen or if he notices abscess formation or discharge. Can try topical antiseptic such as chlorhexidine once a week or topical clindamycin BID if no improvement in symptoms.

## 2016-08-31 NOTE — Patient Instructions (Addendum)
It was a pleasure to meet you today, [name].   I have refilled your enalapril and atorvastatin and sent the prescription to your pharmacy.   Keep an eye on your rash. If your symptoms start to worsen please call the clinic and ask to speak with Dr. Frederico Hamman.      Hidradenitis Suppurativa Hidradenitis suppurativa is a long-term (chronic) skin disease that starts with blocked sweat glands or hair follicles. Bacteria may grow in these blocked openings of your skin. Hidradenitis suppurativa is like a severe form of acne that develops in areas of your body where acne would be unusual. It is most likely to affect the areas of your body where skin rubs against skin and becomes moist. This includes your:  Underarms.  Groin.  Genital areas.  Buttocks.  Upper thighs.  Breasts.  Hidradenitis suppurativa may start out with small pimples. The pimples can develop into deep sores that break open (rupture) and drain pus. Over time your skin may thicken and become scarred. Hidradenitis suppurativa cannot be passed from person to person. What are the causes? The exact cause of hidradenitis suppurativa is not known. This condition may be due to:  Male and male hormones. The condition is rare before and after puberty.  An overactive body defense system (immune system). Your immune system may overreact to the blocked hair follicles or sweat glands and cause swelling and pus-filled sores.  What increases the risk? You may have a higher risk of hidradenitis suppurativa if you:  Are a woman.  Are between ages 7 and 17.  Have a family history of hidradenitis suppurativa.  Have a personal history of acne.  Are overweight.  Smoke.  Take the drug lithium.  What are the signs or symptoms? The first signs of an outbreak are usually painful skin bumps that look like pimples. As the condition progresses:  Skin bumps may get bigger and grow deeper into the skin.  Bumps under the skin may  rupture and drain smelly pus.  Skin may become itchy and infected.  Skin may thicken and scar.  Drainage may continue through tunnels under the skin (fistulas).  Walking and moving your arms can become painful.  How is this diagnosed? Your health care provider may diagnose hidradenitis suppurativa based on your medical history and your signs and symptoms. A physical exam will also be done. You may need to see a health care provider who specializes in skin diseases (dermatologist). You may also have tests done to confirm the diagnosis. These can include:  Swabbing a sample of pus or drainage from your skin so it can be sent to the lab and tested for infection.  Blood tests to check for infection.  How is this treated? The same treatment will not work for everybody with hidradenitis suppurativa. Your treatment will depend on how severe your symptoms are. You may need to try several treatments to find what works best for you. Part of your treatment may include cleaning and bandaging (dressing) your wounds. You may also have to take medicines, such as the following:  Antibiotics.  Acne medicines.  Medicines to block or suppress the immune system.  A diabetes medicine (metformin) is sometimes used to treat this condition.  For women, birth control pills can sometimes help relieve symptoms.  You may need surgery if you have a severe case of hidradenitis suppurativa that does not respond to medicine. Surgery may involve:  Using a laser to clear the skin and remove hair follicles.  Opening and draining deep sores.  Removing the areas of skin that are diseased and scarred.  Follow these instructions at home:  Learn as much as you can about your disease, and work closely with your health care providers.  Take medicines only as directed by your health care provider.  If you were prescribed an antibiotic medicine, finish it all even if you start to feel better.  If you are  overweight, losing weight may be very helpful. Try to reach and maintain a healthy weight.  Do not use any tobacco products, including cigarettes, chewing tobacco, or electronic cigarettes. If you need help quitting, ask your health care provider.  Do not shave the areas where you get hidradenitis suppurativa.  Do not wear deodorant.  Wear loose-fitting clothes.  Try not to overheat and get sweaty.  Take a daily bleach bath as directed by your health care provider. ? Fill your bathtub halfway with water. ? Pour in  cup of unscented household bleach. ? Soak for 5-10 minutes.  Cover sore areas with a warm, clean washcloth (compress) for 5-10 minutes. Contact a health care provider if:  You have a flare-up of hidradenitis suppurativa.  You have chills or a fever.  You are having trouble controlling your symptoms at home. This information is not intended to replace advice given to you by your health care provider. Make sure you discuss any questions you have with your health care provider. Document Released: 08/20/2003 Document Revised: 06/13/2015 Document Reviewed: 04/07/2013 Elsevier Interactive Patient Education  2018 [employer].

## 2016-09-02 ENCOUNTER — Telehealth: Payer: Self-pay

## 2016-09-02 MED ORDER — CHLORHEXIDINE GLUCONATE 4 % EX SOLN: 5.0000 mL | CUTANEOUS | 1 refills | Status: DC

## 2016-09-02 NOTE — Telephone Encounter (Signed)
Called pt back. Rash is not worsening, but not improving either. Prescribed chlorhexidine soln 4%, which we had talked about during his recent visit. Instructed to apply 2 times weekly.

## 2016-09-02 NOTE — Telephone Encounter (Signed)
Requesting med for rash. Please call pt back.  

## 2016-09-02 NOTE — Telephone Encounter (Signed)
Pt states he would like some cream or ointment for his rash it is not worse but it is not better also states a pill is fine

## 2016-09-20 ENCOUNTER — Other Ambulatory Visit: Payer: Self-pay | Admitting: Cardiovascular Disease

## 2016-10-08 ENCOUNTER — Ambulatory Visit (INDEPENDENT_AMBULATORY_CARE_PROVIDER_SITE_OTHER): Payer: 59 | Admitting: Internal Medicine

## 2016-10-08 ENCOUNTER — Encounter (INDEPENDENT_AMBULATORY_CARE_PROVIDER_SITE_OTHER): Payer: Self-pay

## 2016-10-08 ENCOUNTER — Encounter: Payer: Self-pay | Admitting: Internal Medicine

## 2016-10-08 VITALS — BP 140/100 | HR 58 | Ht 70.0 in | Wt 238.6 lb

## 2016-10-08 DIAGNOSIS — I481 Persistent atrial fibrillation: Secondary | ICD-10-CM | POA: Diagnosis not present

## 2016-10-08 DIAGNOSIS — I10 Essential (primary) hypertension: Secondary | ICD-10-CM | POA: Diagnosis not present

## 2016-10-08 DIAGNOSIS — I4819 Other persistent atrial fibrillation: Secondary | ICD-10-CM

## 2016-10-08 DIAGNOSIS — Z79899 Other long term (current) drug therapy: Secondary | ICD-10-CM

## 2016-10-08 LAB — BASIC METABOLIC PANEL
BUN / CREAT RATIO: 13 (ref 9–20)
BUN: 18 mg/dL (ref 6–24)
CO2: 20 mmol/L (ref 20–29)
Calcium: 9 mg/dL (ref 8.7–10.2)
Chloride: 103 mmol/L (ref 96–106)
Creatinine, Ser: 1.36 mg/dL — ABNORMAL HIGH (ref 0.76–1.27)
GFR calc non Af Amer: 59 mL/min/{1.73_m2} — ABNORMAL LOW (ref >59)
GFR, EST AFRICAN AMERICAN: 69 mL/min/{1.73_m2} (ref >59)
Glucose: 93 mg/dL (ref 65–99)
Potassium: 4.5 mmol/L (ref 3.5–5.2)
Sodium: 138 mmol/L (ref 134–144)

## 2016-10-08 NOTE — Progress Notes (Signed)
HPI Robert White returns today for follow-up. He is a very pleasant 51 year old man with a history of persistent atrial fibrillation and hypertension who has been placed on dofetilide. He is felt well. He denies chest pain, shortness of breath, palpitations, syncope, or peripheral edema. He typically walks between 10,000 and 14,000 steps a day. He also walks 20 minutes a day. His weight has gone down over 60 pounds in the last 2 years.  Allergies  Allergen Reactions  . Cephalexin Swelling  . Penicillins Swelling    Lip swelling Has patient had a PCN reaction causing immediate rash, facial/tongue/throat swelling, SOB or lightheadedness with hypotension: Yes Has patient had a PCN reaction causing severe rash involving mucus membranes or skin necrosis: No Has patient had a PCN reaction that required hospitalization No Has patient had a PCN reaction occurring within the last 10 years: Yes If all of the above answers are "NO", then may proceed with Cephalosporin use.     Current Outpatient Prescriptions  Medication Sig Dispense Refill  . acetaminophen (TYLENOL) 500 MG tablet Take 500 mg by mouth every 6 (six) hours as needed for mild pain.    [name] atorvastatin (LIPITOR) 20 MG tablet TAKE 1 TABLET (20 MG TOTAL) BY MOUTH DAILY AT 6 PM. 90 tablet 10  . carvedilol (COREG) 25 MG tablet Take 1.5 tablets (37.5 mg total) by mouth 2 (two) times daily. 270 tablet 3  . Chlorhexidine Gluconate 4 % SOLN Apply 5 mLs topically 2 (two) times a week. Swab affected area for at least 2 minutes then dry with towel. 118 mL 1  . diltiazem (CARDIZEM CD) 240 MG 24 hr capsule Take 1 capsule (240 mg total) by mouth daily. 90 capsule 3  . dofetilide (TIKOSYN) 500 MCG capsule TAKE 1 CAPSULE (500 MCG TOTAL) BY MOUTH 2 (TWO) TIMES DAILY. 60 capsule 3  . ELIQUIS 5 MG TABS tablet TAKE 1 TABLET (5 MG TOTAL) BY MOUTH 2 (TWO) TIMES DAILY. 180 tablet 1  . enalapril (VASOTEC) 20 MG tablet Take 1 tablet (20 mg total) by mouth 2  (two) times daily. 180 tablet 10  . KLOR-CON M20 20 MEQ tablet TAKE 2 TABLETS (40 MEQ TOTAL) BY MOUTH 2 (TWO) TIMES DAILY. 60 tablet 6  . magnesium oxide (MAG-OX) 400 (241.3 Mg) MG tablet Take 1 tablet (400 mg total) by mouth daily. 90 tablet 2  . OVER THE COUNTER MEDICATION Place 1 drop into both eyes daily as needed (dry eyes). Over the counter lubricating eye drop     No current facility-administered medications for this visit.      Past Medical History:  Diagnosis Date  . Atrial fibrillation, persistent ([place])   . Atrial flutter ([place])   . Chronic renal insufficiency    Baseline Cr around 1.2, likely secondary to HTN  . Dilated cardiomyopathy ([place])    Hx of-resolves on echo 2008-EF 60-70%  . Hyperlipidemia   . Hypertension   . Left ventricular dysfunction    ejection fraction 25-30% by 2-D echo  . Nephrolithiasis 2012   Calcium oxalate stones    ROS:   All systems reviewed and negative except as noted in the HPI.   Past Surgical History:  Procedure Laterality Date  . CARDIOVERSION N/A 07/01/2015   Procedure: CARDIOVERSION;  Surgeon: Sanda Klein, MD;  Location: [facility] ENDOSCOPY;  Service: Cardiovascular;  Laterality: N/A;  . TEE WITHOUT CARDIOVERSION N/A 07/01/2015   Procedure: TRANSESOPHAGEAL ECHOCARDIOGRAM (TEE);  Surgeon: Sanda Klein, MD;  Location: [facility]  ENDOSCOPY;  Service: Cardiovascular;  Laterality: N/A;  . WISDOM TOOTH EXTRACTION       Family History  Problem Relation Age of Onset  . Prostate cancer Father   . Colon cancer Neg Hx      Social History   Social History  . Marital status: Married    Spouse name: N/A  . Number of children: N/A  . Years of education: N/A   Occupational History  . Not on file.   Social History Main Topics  . Smoking status: Never Smoker  . Smokeless tobacco: Never Used  . Alcohol use No  . Drug use: No  . Sexual activity: Not on file   Other Topics Concern  . Not on file   Social History Narrative   Married, 2  daughters ages 69,20 (2012). Exercises regularly.     BP (!) 140/100   Pulse (!) 58   Ht 5\' 10"  (1.778 m)   Wt 238 lb 9.6 oz (108.2 kg)   SpO2 98%   BMI 34.24 kg/m   Physical Exam:  Well appearing 51 year old man, NAD HEENT: Unremarkable Neck:  No JVD, no thyromegally Lymphatics:  No adenopathy Back:  No CVA tenderness Lungs:  Clear, with no wheezes, rales, or rhonchi HEART:  Regular rate rhythm, no murmurs, no rubs, no clicks Abd:  soft, positive bowel sounds, no organomegally, no rebound, no guarding Ext:  2 plus pulses, no edema, no cyanosis, no clubbing Skin:  No rashes no nodules Neuro:  CN II through XII intact, motor grossly intact  EKG - normal sinus rhythm with left ventricular hypertrophy, QT interval is within normal limits.   Assess/Plan: 1. Paroxysmal/persistent atrial fibrillation - the patient is maintaining sinus rhythm very nicely on dofetilide therapy. His QT interval is good. He will continue his current medications. We will ask the patient to check electrolytes today as he is been taking supplemental potassium. 2. Chronic systolic heart failure - his symptoms are now class I. I've asked the patient to see [place] back in a year and if his EF is improved, then no indication for ICD. 3. Hypertension - his blood pressure is a bit high today. He is brought readings of his blood pressure over the last week and it typically runs in the low 130s over mid to high 80s. 4. Obesity - he has now lost 60 pounds and plans to lose another 20. I've encouraged the patient to be methodical about his weight loss with a goal of losing one or perhaps 2 pounds month.  Cristopher Peru, M.D.

## 2016-10-08 NOTE — Patient Instructions (Addendum)
Medication Instructions:  Your physician recommends that you continue on your current medications as directed. Please refer to the Current Medication list given to you today.  Labwork: Please get a BMP today.  Testing/Procedures: None ordered.  Follow-Up: Your physician wants you to follow-up in: one year with Dr. Lovena Le.   You will receive a reminder letter in the mail two months in advance. If you don't receive a letter, please call our office to schedule the follow-up appointment.   Any Other Special Instructions Will Be Listed Below (If Applicable).     If you need a refill on your cardiac medications before your next appointment, please call your pharmacy.

## 2016-10-24 ENCOUNTER — Other Ambulatory Visit: Payer: Self-pay | Admitting: Physician Assistant

## 2016-11-01 DIAGNOSIS — Z23 Encounter for immunization: Secondary | ICD-10-CM | POA: Diagnosis not present

## 2017-01-24 ENCOUNTER — Other Ambulatory Visit: Payer: Self-pay | Admitting: Cardiovascular Disease

## 2017-01-25 ENCOUNTER — Other Ambulatory Visit: Payer: Self-pay | Admitting: Internal Medicine

## 2017-01-25 MED ORDER — POTASSIUM CHLORIDE CRYS ER 20 MEQ PO TBCR: 40.0000 meq | EXTENDED_RELEASE_TABLET | Freq: Two times a day (BID) | ORAL | 8 refills | Status: DC

## 2017-02-10 ENCOUNTER — Telehealth: Payer: Self-pay

## 2017-02-10 NOTE — Telephone Encounter (Signed)
**Note De-Identified Lavine Hargrove Obfuscation** We received a PA form Annastasia Haskins fax to complete for Dofetilide from [facility]. I have completed the form and gave it to Tommye Standard, PA-c's covering CMA, Cammie Mcgee, to obtain her signature when she returns to the office.

## 2017-02-12 ENCOUNTER — Other Ambulatory Visit: Payer: Self-pay | Admitting: Cardiovascular Disease

## 2017-02-12 NOTE — Telephone Encounter (Signed)
Rx has been sent to the pharmacy electronically. ° °

## 2017-02-15 NOTE — Telephone Encounter (Signed)
**Note De-Identified Jenissa Tyrell Obfuscation** Tommye Standard, PA-c has signed the Dofetilide PA form and I have faxed it back to [place].

## 2017-02-16 NOTE — Telephone Encounter (Signed)
**Note De-Identified Robert White Obfuscation** Dofetilide approval received Robert White fax from [place]. Approval good from 02/15/2017 until 02/16/2019.  I have notified the pts pharmacy.

## 2017-03-24 ENCOUNTER — Other Ambulatory Visit: Payer: Self-pay | Admitting: Cardiovascular Disease

## 2017-03-25 ENCOUNTER — Other Ambulatory Visit: Payer: Self-pay

## 2017-03-25 MED ORDER — APIXABAN 5 MG PO TABS: 5.0000 mg | ORAL_TABLET | Freq: Two times a day (BID) | ORAL | 1 refills | Status: DC

## 2017-07-01 ENCOUNTER — Other Ambulatory Visit: Payer: Self-pay

## 2017-07-01 MED ORDER — CARVEDILOL 25 MG PO TABS: 37.5000 mg | ORAL_TABLET | Freq: Two times a day (BID) | ORAL | 0 refills | Status: DC

## 2017-07-18 ENCOUNTER — Encounter: Payer: Self-pay | Admitting: *Deleted

## 2017-07-19 DIAGNOSIS — R040 Epistaxis: Secondary | ICD-10-CM | POA: Diagnosis not present

## 2017-07-19 DIAGNOSIS — H6121 Impacted cerumen, right ear: Secondary | ICD-10-CM | POA: Diagnosis not present

## 2017-07-19 DIAGNOSIS — H938X2 Other specified disorders of left ear: Secondary | ICD-10-CM | POA: Diagnosis not present

## 2017-08-12 ENCOUNTER — Other Ambulatory Visit: Payer: Self-pay | Admitting: Cardiovascular Disease

## 2017-08-13 ENCOUNTER — Other Ambulatory Visit: Payer: Self-pay | Admitting: Cardiovascular Disease

## 2017-08-24 ENCOUNTER — Other Ambulatory Visit: Payer: Self-pay | Admitting: Cardiovascular Disease

## 2017-08-25 NOTE — Telephone Encounter (Signed)
This is Dr. Berry's pt 

## 2017-08-26 MED ORDER — MAGNESIUM OXIDE 400 (241.3 MG) MG PO TABS: 400.0000 mg | ORAL_TABLET | Freq: Every day | ORAL | 0 refills | Status: DC

## 2017-08-26 NOTE — Addendum Note (Signed)
Addended by: Rush Landmark L on: 08/26/2017 10:30 AM   Modules accepted: Orders

## 2017-09-23 ENCOUNTER — Other Ambulatory Visit: Payer: Self-pay | Admitting: Internal Medicine

## 2017-09-23 ENCOUNTER — Other Ambulatory Visit: Payer: Self-pay | Admitting: Physician Assistant

## 2017-09-27 ENCOUNTER — Other Ambulatory Visit: Payer: Self-pay

## 2017-09-28 ENCOUNTER — Other Ambulatory Visit: Payer: Self-pay | Admitting: Internal Medicine

## 2017-09-28 MED ORDER — APIXABAN 5 MG PO TABS: 5.0000 mg | ORAL_TABLET | Freq: Two times a day (BID) | ORAL | 1 refills | Status: DC

## 2017-10-02 DIAGNOSIS — Z23 Encounter for immunization: Secondary | ICD-10-CM | POA: Diagnosis not present

## 2017-10-15 ENCOUNTER — Other Ambulatory Visit: Payer: Self-pay | Admitting: Physician Assistant

## 2017-10-18 ENCOUNTER — Ambulatory Visit: Payer: 59 | Admitting: Internal Medicine

## 2017-10-19 ENCOUNTER — Ambulatory Visit (INDEPENDENT_AMBULATORY_CARE_PROVIDER_SITE_OTHER): Payer: 59 | Admitting: Internal Medicine

## 2017-10-19 ENCOUNTER — Encounter: Payer: Self-pay | Admitting: Internal Medicine

## 2017-10-19 VITALS — BP 138/86 | HR 69 | Ht 70.0 in | Wt 236.0 lb

## 2017-10-19 DIAGNOSIS — I10 Essential (primary) hypertension: Secondary | ICD-10-CM | POA: Diagnosis not present

## 2017-10-19 DIAGNOSIS — I4819 Other persistent atrial fibrillation: Secondary | ICD-10-CM

## 2017-10-19 DIAGNOSIS — I519 Heart disease, unspecified: Secondary | ICD-10-CM

## 2017-10-19 MED ORDER — POTASSIUM CHLORIDE CRYS ER 20 MEQ PO TBCR: 40.0000 meq | EXTENDED_RELEASE_TABLET | Freq: Two times a day (BID) | ORAL | 3 refills | Status: DC

## 2017-10-19 MED ORDER — DILTIAZEM HCL ER COATED BEADS 240 MG PO CP24: 240.0000 mg | ORAL_CAPSULE | Freq: Every day | ORAL | 3 refills | Status: DC

## 2017-10-19 MED ORDER — MAGNESIUM OXIDE 400 (241.3 MG) MG PO TABS: 400.0000 mg | ORAL_TABLET | Freq: Every day | ORAL | 3 refills | Status: DC

## 2017-10-19 MED ORDER — DOFETILIDE 500 MCG PO CAPS: 500.0000 ug | ORAL_CAPSULE | Freq: Two times a day (BID) | ORAL | 3 refills | Status: DC

## 2017-10-19 MED ORDER — ENALAPRIL MALEATE 20 MG PO TABS: 20.0000 mg | ORAL_TABLET | Freq: Two times a day (BID) | ORAL | 3 refills | Status: DC

## 2017-10-19 MED ORDER — APIXABAN 5 MG PO TABS: 5.0000 mg | ORAL_TABLET | Freq: Two times a day (BID) | ORAL | 3 refills | Status: DC

## 2017-10-19 MED ORDER — ATORVASTATIN CALCIUM 20 MG PO TABS: 20.0000 mg | ORAL_TABLET | Freq: Every day | ORAL | 3 refills | Status: DC

## 2017-10-19 MED ORDER — CARVEDILOL 25 MG PO TABS: 37.5000 mg | ORAL_TABLET | Freq: Two times a day (BID) | ORAL | 3 refills | Status: DC

## 2017-10-19 NOTE — Progress Notes (Signed)
HPI [name] returns today for followup of his PAF. He is a pleasant middle aged man with LV dysfunction (thought tachy mediated), persisitent atrial fib on dofetilide, HTN. He has done well in the interim. He denies chest pain or sob. He remains active working up to 12 hours a day. No syncope and no edema. He is also walking 4 miles every day with no limitation. His last echo over a year ago showed an EF of 30%.  Allergies  Allergen Reactions  . Cephalexin Swelling  . Penicillins Swelling    Lip swelling Has patient had a PCN reaction causing immediate rash, facial/tongue/throat swelling, SOB or lightheadedness with hypotension: Yes Has patient had a PCN reaction causing severe rash involving mucus membranes or skin necrosis: No Has patient had a PCN reaction that required hospitalization No Has patient had a PCN reaction occurring within the last 10 years: Yes If all of the above answers are "NO", then may proceed with Cephalosporin use.     Current Outpatient Medications  Medication Sig Dispense Refill  . acetaminophen (TYLENOL) 500 MG tablet Take 500 mg by mouth every 6 (six) hours as needed for mild pain.    [name] apixaban (ELIQUIS) 5 MG TABS tablet Take 1 tablet (5 mg total) by mouth 2 (two) times daily. 180 tablet 1  . atorvastatin (LIPITOR) 20 MG tablet TAKE 1 TABLET (20 MG TOTAL) BY MOUTH DAILY AT 6 PM. 90 tablet 10  . carvedilol (COREG) 25 MG tablet Take 1.5 tablets (37.5 mg total) by mouth 2 (two) times daily. 270 tablet 0  . Chlorhexidine Gluconate 4 % SOLN Apply 5 mLs topically 2 (two) times a week. Swab affected area for at least 2 minutes then dry with towel. 118 mL 1  . diltiazem (CARDIZEM CD) 240 MG 24 hr capsule Take 1 capsule (240 mg total) by mouth daily. 90 capsule 3  . diltiazem (CARDIZEM CD) 240 MG 24 hr capsule TAKE ONE CAPSULE BY MOUTH EVERY DAY 30 capsule 11  . dofetilide (TIKOSYN) 500 MCG capsule Take 1 capsule (500 mcg total) by mouth 2 (two) times daily.  Please keep upcoming appt in October for future refills. Thank you 60 capsule 0  . enalapril (VASOTEC) 20 MG tablet Take 1 tablet (20 mg total) by mouth 2 (two) times daily. 180 tablet 10  . magnesium oxide (MAG-OX) 400 (241.3 Mg) MG tablet Take 1 tablet (400 mg total) by mouth daily. 90 tablet 0  . OVER THE COUNTER MEDICATION Place 1 drop into both eyes daily as needed (dry eyes). Over the counter lubricating eye drop    . potassium chloride SA (KLOR-CON M20) 20 MEQ tablet Take 2 tablets (40 mEq total) by mouth 2 (two) times daily. 120 tablet 8   No current facility-administered medications for this visit.      Past Medical History:  Diagnosis Date  . Atrial fibrillation, persistent   . Atrial flutter ([place])   . Chronic renal insufficiency    Baseline Cr around 1.2, likely secondary to HTN  . Dilated cardiomyopathy ([place])    Hx of-resolves on echo 2008-EF 60-70%  . Hyperlipidemia   . Hypertension   . Left ventricular dysfunction    ejection fraction 25-30% by 2-D echo  . Nephrolithiasis 2012   Calcium oxalate stones    ROS:   All systems reviewed and negative except as noted in the HPI.   Past Surgical History:  Procedure Laterality Date  . CARDIOVERSION N/A 07/01/2015  Procedure: CARDIOVERSION;  Surgeon: Sanda Klein, MD;  Location: [facility] ENDOSCOPY;  Service: Cardiovascular;  Laterality: N/A;  . TEE WITHOUT CARDIOVERSION N/A 07/01/2015   Procedure: TRANSESOPHAGEAL ECHOCARDIOGRAM (TEE);  Surgeon: Sanda Klein, MD;  Location: [facility] ENDOSCOPY;  Service: Cardiovascular;  Laterality: N/A;  . WISDOM TOOTH EXTRACTION       Family History  Problem Relation Age of Onset  . Prostate cancer Father   . Colon cancer Neg Hx      Social History   Socioeconomic History  . Marital status: Married    Spouse name: Not on file  . Number of children: Not on file  . Years of education: Not on file  . Highest education level: Not on file  Occupational History  . Not on file  Social  Needs  . Financial resource strain: Not on file  . Food insecurity:    Worry: Not on file    Inability: Not on file  . Transportation needs:    Medical: Not on file    Non-medical: Not on file  Tobacco Use  . Smoking status: Never Smoker  . Smokeless tobacco: Never Used  Substance and Sexual Activity  . Alcohol use: No    Alcohol/week: 0.0 standard drinks  . Drug use: No  . Sexual activity: Not on file  Lifestyle  . Physical activity:    Days per week: Not on file    Minutes per session: Not on file  . Stress: Not on file  Relationships  . Social connections:    Talks on phone: Not on file    Gets together: Not on file    Attends religious service: Not on file    Active member of club or organization: Not on file    Attends meetings of clubs or organizations: Not on file    Relationship status: Not on file  . Intimate partner violence:    Fear of current or ex partner: Not on file    Emotionally abused: Not on file    Physically abused: Not on file    Forced sexual activity: Not on file  Other Topics Concern  . Not on file  Social History Narrative   Married, 2 daughters ages 6,20 (2012). Exercises regularly.     BP 138/86   Pulse 69   Ht 5\' 10"  (1.778 m)   Wt 236 lb (107 kg)   BMI 33.86 kg/m   Physical Exam:  Well appearing NAD HEENT: Unremarkable Neck:  No JVD, no thyromegally Lymphatics:  No adenopathy Back:  No CVA tenderness Lungs:  Clear with no wheezes HEART:  Regular rate rhythm, no murmurs, no rubs, no clicks Abd:  soft, positive bowel sounds, no organomegally, no rebound, no guarding Ext:  2 plus pulses, no edema, no cyanosis, no clubbing Skin:  No rashes no nodules Neuro:  CN II through XII intact, motor grossly intact  EKG - nsr   Assess/Plan: 1. Chronic systolic heart failure - he is thought to have a tachy mediated problem. We discussed repeating the 2D echo. However, he has class 1 symptoms and would not be a candidate for an ICD even  if his EF were down. He will continue his current meds and maintain a low sodium diet.  2. PAF - he is maintaining NSR. He has had no symptomatic atrial fib. 3. HTN - his blood pressure is fairly well controlled. He notes it is always higher in the doctors office. I spent 25 minutes including 50% face to face time  with the patient discussing the above issues. Mikle Bosworth.D.

## 2017-10-19 NOTE — Patient Instructions (Signed)

## 2017-10-26 NOTE — Progress Notes (Signed)
   CC: HTN  HPI:  Robert White is a 52 y.o. with a PMHx of HTN, HLD, persistent atrial fibrillation on dofetilide, systolic HF, LV dysfunction thought to be tachy mediated (last echo showed 30% EF) presenting with for a routine follow up.  Please see problem based assessment and plan for further details.    Past Medical History:  Diagnosis Date  . Atrial fibrillation, persistent   . Atrial flutter ([place])   . Chronic renal insufficiency    Baseline Cr around 1.2, likely secondary to HTN  . Dilated cardiomyopathy ([place])    Hx of-resolves on echo 2008-EF 60-70%  . Hyperlipidemia   . Hypertension   . Left ventricular dysfunction    ejection fraction 25-30% by 2-D echo  . Nephrolithiasis 2012   Calcium oxalate stones   Review of Systems:   Review of Systems  Constitutional: Negative for chills and fever.  Respiratory: Negative for cough and shortness of breath.   Cardiovascular: Negative for chest pain, palpitations and leg swelling.  Gastrointestinal: Negative for abdominal pain, heartburn, nausea and vomiting.  Musculoskeletal: Negative for myalgias.  Neurological: Negative for sensory change and headaches.    Physical Exam:  Vitals:   10/27/17 1324 10/27/17 1354  BP: (!) 155/101 (!) 164/96  Pulse: 64 68  Temp: 98.2 F (36.8 C)   TempSrc: Oral   SpO2: 100%   Weight: 247 lb 3.2 oz (112.1 kg)   Height: 5\' 10"  (1.778 m)     Physical Exam  Constitutional: He is oriented to person, place, and time and well-developed, well-nourished, and in no distress.  Cardiovascular: Normal rate, regular rhythm and normal heart sounds.  No murmur heard. Pulmonary/Chest: Effort normal and breath sounds normal. No respiratory distress. He has no wheezes.  Abdominal: Soft. Bowel sounds are normal.  Musculoskeletal: He exhibits no edema.  Neurological: He is alert and oriented to person, place, and time.  Skin: Skin is warm and dry.    Assessment & Plan:   See Encounters Tab for  problem based charting.  Patient seen with Dr. Eppie Boschert

## 2017-10-27 ENCOUNTER — Encounter: Payer: Self-pay | Admitting: Internal Medicine

## 2017-10-27 ENCOUNTER — Other Ambulatory Visit: Payer: Self-pay

## 2017-10-27 ENCOUNTER — Ambulatory Visit (INDEPENDENT_AMBULATORY_CARE_PROVIDER_SITE_OTHER): Payer: 59 | Admitting: Internal Medicine

## 2017-10-27 VITALS — BP 164/96 | HR 68 | Temp 98.2°F | Ht 70.0 in | Wt 247.2 lb

## 2017-10-27 DIAGNOSIS — I10 Essential (primary) hypertension: Secondary | ICD-10-CM

## 2017-10-27 DIAGNOSIS — Z7901 Long term (current) use of anticoagulants: Secondary | ICD-10-CM

## 2017-10-27 DIAGNOSIS — Z79899 Other long term (current) drug therapy: Secondary | ICD-10-CM

## 2017-10-27 DIAGNOSIS — I5020 Unspecified systolic (congestive) heart failure: Secondary | ICD-10-CM | POA: Diagnosis not present

## 2017-10-27 DIAGNOSIS — N189 Chronic kidney disease, unspecified: Secondary | ICD-10-CM

## 2017-10-27 DIAGNOSIS — I4819 Other persistent atrial fibrillation: Secondary | ICD-10-CM

## 2017-10-27 DIAGNOSIS — N183 Chronic kidney disease, stage 3 (moderate): Secondary | ICD-10-CM

## 2017-10-27 DIAGNOSIS — I130 Hypertensive heart and chronic kidney disease with heart failure and stage 1 through stage 4 chronic kidney disease, or unspecified chronic kidney disease: Secondary | ICD-10-CM

## 2017-10-27 DIAGNOSIS — Z0000 Encounter for general adult medical examination without abnormal findings: Secondary | ICD-10-CM

## 2017-10-27 DIAGNOSIS — E785 Hyperlipidemia, unspecified: Secondary | ICD-10-CM

## 2017-10-27 NOTE — Assessment & Plan Note (Addendum)
BP was 164/96, repeat was 155/101 today. Patient reports his BP tends to be high at doctor's appointments due to anxiety. Showed BP readings from home which were mostly in the 120s and 130s. He brought his BP machine in and it read 155/106 and BP cuff in the office read 147/98; this was repeated. He stated his machine is from 2002. Given his home machine read slightly higher than office reading we will trust his home readings but asked him to bring the machine in again during his next visit.  - continue enalapril 20 mg BID, coreg 37.5 mg BID, and cardizem 240 mg qd

## 2017-10-27 NOTE — Patient Instructions (Addendum)
Robert White,  It was a pleasure meeting you today! I am glad everything has been going well for you and very impressed with your significant weight loss! I will give you a call if anything is abnormal with your lab work. Keep up the good work and we will follow up with you in a year or sooner if anything comes up!

## 2017-10-27 NOTE — Assessment & Plan Note (Signed)
-   checked lipid panel today - continue atorvastatin 20 mg qd for now - consider increasing to 40 mg qd; will f/u with lipid panel

## 2017-10-27 NOTE — Assessment & Plan Note (Signed)
-   f/u bmet, ordered today

## 2017-10-28 LAB — BMP8+ANION GAP
Anion Gap: 12 mmol/L (ref 10.0–18.0)
BUN/Creatinine Ratio: 13 (ref 9–20)
BUN: 20 mg/dL (ref 6–24)
CO2: 24 mmol/L (ref 20–29)
Calcium: 9.1 mg/dL (ref 8.7–10.2)
Chloride: 105 mmol/L (ref 96–106)
Creatinine, Ser: 1.5 mg/dL — ABNORMAL HIGH (ref 0.76–1.27)
GFR, EST AFRICAN AMERICAN: 61 mL/min/{1.73_m2} (ref >59)
GFR, EST NON AFRICAN AMERICAN: 52 mL/min/{1.73_m2} — AB (ref >59)
Glucose: 88 mg/dL (ref 65–99)
POTASSIUM: 4.5 mmol/L (ref 3.5–5.2)
SODIUM: 141 mmol/L (ref 134–144)

## 2017-10-28 LAB — LIPID PANEL
CHOL/HDL RATIO: 3.2 ratio (ref 0.0–5.0)
Cholesterol, Total: 159 mg/dL (ref 100–199)
HDL: 50 mg/dL (ref >39)
LDL Calculated: 97 mg/dL (ref 0–99)
Triglycerides: 60 mg/dL (ref 0–149)
VLDL Cholesterol Cal: 12 mg/dL (ref 5–40)

## 2017-10-28 LAB — CBC
HEMATOCRIT: 45.4 % (ref 37.5–51.0)
HEMOGLOBIN: 14.9 g/dL (ref 13.0–17.7)
MCH: 29.8 pg (ref 26.6–33.0)
MCHC: 32.8 g/dL (ref 31.5–35.7)
MCV: 91 fL (ref 79–97)
Platelets: 284 10*3/uL (ref 150–450)
RBC: 5 x10E6/uL (ref 4.14–5.80)
RDW: 12.5 % (ref 12.3–15.4)
WBC: 6.9 10*3/uL (ref 3.4–10.8)

## 2017-10-28 NOTE — Progress Notes (Signed)
Patient ID: SANTE BIEDERMANN, male   DOB: 08/14/1965, 52 y.o.   MRN: [id number]  I saw and evaluated the patient.  I personally confirmed the key portions of Dr. Olevia Perches history and exam and reviewed pertinent patient test results.  The assessment, diagnosis, and plan were formulated together and I agree with the documentation in the resident's note.  Interestingly his blood pressure meter reads higher than the actual blood pressure we measured.  Thus, I believe his normotension at home is more accurate of his true state and he may have some additional white coat hypertension.  I therefore agree with continuing the current regimen and reassessing blood pressure and blood pressure cuff at the follow-up visit.  His stage III chronic kidney disease has worsened slightly over the last year with his eGFR dropping from 69 to 61.  Given this information, we will move the follow-up from 1 year to 6 months to reassess this sooner with a repeat BMP.  In the interim we will continue the BID enalapril and aggressively treat his essential hypertension.  With the lipid panel and blood pressure his ACC/AHA 10 year risk is 13.3%, thus he would benefit from moving the atorvastatin dose from 20 mg to 40 mg daily (moderate intensity to high intensity).  We will make this change.  Although he would also qualify for daily ASA given this 10-year risk, we will not initiate it because of the Eliquis he requires for his atrial fibrillation.

## 2017-10-28 NOTE — Addendum Note (Signed)
Addended by: Oval Linsey D on: 10/28/2017 06:23 AM   Modules accepted: Level of Service

## 2017-11-19 ENCOUNTER — Other Ambulatory Visit: Payer: Self-pay | Admitting: Internal Medicine

## 2017-11-19 DIAGNOSIS — I10 Essential (primary) hypertension: Secondary | ICD-10-CM

## 2017-12-01 ENCOUNTER — Other Ambulatory Visit: Payer: 59

## 2017-12-01 DIAGNOSIS — I10 Essential (primary) hypertension: Secondary | ICD-10-CM

## 2017-12-02 LAB — URINALYSIS, ROUTINE W REFLEX MICROSCOPIC
BILIRUBIN UA: NEGATIVE
Glucose, UA: NEGATIVE
KETONES UA: NEGATIVE
LEUKOCYTES UA: NEGATIVE
Nitrite, UA: NEGATIVE
Protein, UA: NEGATIVE
RBC UA: NEGATIVE
SPEC GRAV UA: 1.007 (ref 1.005–1.030)
Urobilinogen, Ur: 0.2 mg/dL (ref 0.2–1.0)
pH, UA: 7 (ref 5.0–7.5)

## 2017-12-02 LAB — CREATININE, URINE, RANDOM: Creatinine, Urine: 33 mg/dL

## 2018-01-04 DIAGNOSIS — R1032 Left lower quadrant pain: Secondary | ICD-10-CM | POA: Diagnosis not present

## 2018-01-04 DIAGNOSIS — R1012 Left upper quadrant pain: Secondary | ICD-10-CM | POA: Diagnosis not present

## 2018-02-07 ENCOUNTER — Other Ambulatory Visit: Payer: Self-pay | Admitting: Internal Medicine

## 2018-04-03 ENCOUNTER — Other Ambulatory Visit: Payer: Self-pay | Admitting: Internal Medicine

## 2018-06-08 ENCOUNTER — Other Ambulatory Visit: Payer: Self-pay | Admitting: Internal Medicine

## 2018-06-08 DIAGNOSIS — I10 Essential (primary) hypertension: Secondary | ICD-10-CM

## 2018-06-28 ENCOUNTER — Other Ambulatory Visit: Payer: 59

## 2018-07-06 ENCOUNTER — Encounter: Payer: 59 | Admitting: Internal Medicine

## 2018-07-17 ENCOUNTER — Encounter: Payer: Self-pay | Admitting: *Deleted

## 2018-08-01 ENCOUNTER — Other Ambulatory Visit: Payer: Self-pay

## 2018-08-01 ENCOUNTER — Other Ambulatory Visit (INDEPENDENT_AMBULATORY_CARE_PROVIDER_SITE_OTHER): Payer: 59

## 2018-08-01 DIAGNOSIS — I10 Essential (primary) hypertension: Secondary | ICD-10-CM

## 2018-08-02 LAB — BMP8+ANION GAP
Anion Gap: 17 mmol/L (ref 10.0–18.0)
BUN/Creatinine Ratio: 13 (ref 9–20)
BUN: 19 mg/dL (ref 6–24)
CO2: 21 mmol/L (ref 20–29)
Calcium: 9.3 mg/dL (ref 8.7–10.2)
Chloride: 102 mmol/L (ref 96–106)
Creatinine, Ser: 1.49 mg/dL — ABNORMAL HIGH (ref 0.76–1.27)
GFR calc Af Amer: 61 mL/min/{1.73_m2} (ref >59)
GFR calc non Af Amer: 52 mL/min/{1.73_m2} — ABNORMAL LOW (ref >59)
Glucose: 93 mg/dL (ref 65–99)
Potassium: 4.5 mmol/L (ref 3.5–5.2)
Sodium: 140 mmol/L (ref 134–144)

## 2018-11-08 ENCOUNTER — Other Ambulatory Visit: Payer: Self-pay | Admitting: Internal Medicine

## 2018-11-16 ENCOUNTER — Other Ambulatory Visit: Payer: Self-pay

## 2018-11-16 ENCOUNTER — Ambulatory Visit (INDEPENDENT_AMBULATORY_CARE_PROVIDER_SITE_OTHER): Payer: 59 | Admitting: Internal Medicine

## 2018-11-16 ENCOUNTER — Encounter: Payer: Self-pay | Admitting: Internal Medicine

## 2018-11-16 VITALS — BP 153/99 | HR 66 | Temp 98.1°F | Ht 70.0 in | Wt 241.1 lb

## 2018-11-16 DIAGNOSIS — Z7901 Long term (current) use of anticoagulants: Secondary | ICD-10-CM

## 2018-11-16 DIAGNOSIS — Z79899 Other long term (current) drug therapy: Secondary | ICD-10-CM

## 2018-11-16 DIAGNOSIS — I4819 Other persistent atrial fibrillation: Secondary | ICD-10-CM

## 2018-11-16 DIAGNOSIS — I10 Essential (primary) hypertension: Secondary | ICD-10-CM

## 2018-11-16 NOTE — Assessment & Plan Note (Signed)
Patients BP was elevated today at 148/100 and 153/99. He shows his BP readings at home, which are at goal (<125/85). Home meter validated at last visit, readings were higher than office.   - Continue enalapril 20 mg twice daily, Carvediol 37.5 mg twice daily and diltiazem 240 mg daily

## 2018-11-16 NOTE — Progress Notes (Signed)
   CC: Atrial fibrillation and hypertension  HPI:  [name] is a 53 y.o. with past medical history below. Please see problem based charting for details of presentation, assessment, and plan.    Past Medical History:  Diagnosis Date  . Atrial fibrillation, persistent ([place])   . Atrial flutter ([place])   . Chronic renal insufficiency    Baseline Cr around 1.2, likely secondary to HTN  . Dilated cardiomyopathy ([place])    Hx of-resolves on echo 2008-EF 60-70%  . Hyperlipidemia   . Hypertension   . Left ventricular dysfunction    ejection fraction 25-30% by 2-D echo  . Nephrolithiasis 2012   Calcium oxalate stones   Review of Systems:  Review of Systems  Constitutional: Negative for chills and fever.  Respiratory: Negative for cough and shortness of breath.   Cardiovascular: Negative for chest pain and palpitations.     Physical Exam:  Vitals:   11/16/18 0836 11/16/18 0840  BP: (!) 149/100 (!) 153/99  Pulse: 68 66  Temp: 98.1 F (36.7 C)   TempSrc: Oral   SpO2: 100%   Weight: 241 lb 1.6 oz (109.4 kg)   Height: 5\' 10"  (1.778 m)    Physical Exam Constitutional:      General: He is not in acute distress.    Appearance: Normal appearance.  Cardiovascular:     Rate and Rhythm: Normal rate and regular rhythm.  Pulmonary:     Breath sounds: Normal breath sounds. No wheezing, rhonchi or rales.  Abdominal:     General: Bowel sounds are normal.     Tenderness: There is no abdominal tenderness.  Skin:    General: Skin is warm and dry.  Psychiatric:        Mood and Affect: Mood normal.        Behavior: Behavior normal.      Assessment & Plan:   See Encounters Tab for problem based charting.  Patient seen with Dr. Heber Harvey [place]

## 2018-11-16 NOTE — Assessment & Plan Note (Addendum)
History of two failed cardioversions in 2017.Patient follow with cardiologist, Dr.Taylor. Reports no symptoms today and compliant on diltiazem, Carvedilol, Tikosyn, and Apixaban. HR 66, 68  Assessment: History of persistent Afib Plan: Continue diltiazem 240 mg, Carvedilol 37.5 mg BID, Dofetilide 500 mcg BID, Apixaban 5 mg BID. - Continue Magnesium oxide and potassium chloride - Recent BMP in July nl, follow up with cardiology schedule in December.

## 2018-11-16 NOTE — Patient Instructions (Signed)
Thank you for trusting me with your care. To recap, today we discussed the following:   High blood pressure - Your pressure was elevated today, but at goal at home. My goal is 125/85 on your home checks. Let me know if your blood pressure is higher.  Atrial Fibrillation - Continue your medications - Follow up with your cardiologist  It was great to meet you.  My best,  Tamsen Snider, MD

## 2018-11-20 ENCOUNTER — Other Ambulatory Visit: Payer: Self-pay | Admitting: Internal Medicine

## 2018-11-21 NOTE — Progress Notes (Signed)
Internal Medicine Clinic Attending  I saw and evaluated the patient.  I personally confirmed the key portions of the history and exam documented by Dr. Steen and I reviewed pertinent patient test results.  The assessment, diagnosis, and plan were formulated together and I agree with the documentation in the resident's note.     

## 2018-12-23 ENCOUNTER — Other Ambulatory Visit: Payer: Self-pay | Admitting: Internal Medicine

## 2018-12-26 NOTE — Telephone Encounter (Signed)
Age 53, weight 109kg, SCr 1.49 on 08/01/18. Pt has annual follow up scheduled this week with Dr Lovena Le, afib indication

## 2018-12-30 ENCOUNTER — Encounter: Payer: Self-pay | Admitting: Internal Medicine

## 2018-12-30 ENCOUNTER — Other Ambulatory Visit: Payer: Self-pay | Admitting: Internal Medicine

## 2018-12-30 ENCOUNTER — Other Ambulatory Visit: Payer: Self-pay

## 2018-12-30 ENCOUNTER — Ambulatory Visit (INDEPENDENT_AMBULATORY_CARE_PROVIDER_SITE_OTHER): Payer: 59 | Admitting: Internal Medicine

## 2018-12-30 VITALS — BP 162/100 | HR 62 | Ht 70.0 in | Wt 237.0 lb

## 2018-12-30 DIAGNOSIS — I4892 Unspecified atrial flutter: Secondary | ICD-10-CM | POA: Diagnosis not present

## 2018-12-30 DIAGNOSIS — I4891 Unspecified atrial fibrillation: Secondary | ICD-10-CM

## 2018-12-30 DIAGNOSIS — I4819 Other persistent atrial fibrillation: Secondary | ICD-10-CM

## 2018-12-30 DIAGNOSIS — I519 Heart disease, unspecified: Secondary | ICD-10-CM | POA: Diagnosis not present

## 2018-12-30 DIAGNOSIS — I10 Essential (primary) hypertension: Secondary | ICD-10-CM

## 2018-12-30 NOTE — Patient Instructions (Signed)
Medication Instructions:  Continue your current medications *If you need a refill on your cardiac medications before your next appointment, please call your pharmacy*  Lab Work: None ordered.  If you have labs (blood work) drawn today and your tests are completely normal, you will receive your results only by: [name] MyChart Message (if you have MyChart) OR . A paper copy in the mail If you have any lab test that is abnormal or we need to change your treatment, we will call you to review the results.  Testing/Procedures:None ordered.   Follow-Up: At [facility], you and your health needs are our priority.  As part of our continuing mission to provide you with exceptional heart care, we have created designated Provider Care Teams.  These Care Teams include your primary Cardiologist (physician) and Advanced Practice Providers (APPs -  Physician Assistants and Nurse Practitioners) who all work together to provide you with the care you need, when you need it.  Your next appointment:   One year with Dr Lovena Le

## 2018-12-30 NOTE — Progress Notes (Signed)
HPI Mr. Yeiser returns today for followup. He is a pleasant 53 yo man with a tachy induced CM, chronic systolic heart failure, PAF, and HTN. He remains active and is very busy working 2 jobs. He has not had symptomatic atrial fib. No syncope or chest pain. No sob.  Allergies  Allergen Reactions  . Cephalexin Swelling  . Penicillins Swelling    Lip swelling Has patient had a PCN reaction causing immediate rash, facial/tongue/throat swelling, SOB or lightheadedness with hypotension: Yes Has patient had a PCN reaction causing severe rash involving mucus membranes or skin necrosis: No Has patient had a PCN reaction that required hospitalization No Has patient had a PCN reaction occurring within the last 10 years: Yes If all of the above answers are "NO", then may proceed with Cephalosporin use.     Current Outpatient Medications  Medication Sig Dispense Refill  . acetaminophen (TYLENOL) 500 MG tablet Take 500 mg by mouth every 6 (six) hours as needed for mild pain.    [name] atorvastatin (LIPITOR) 20 MG tablet Take 1 tablet (20 mg total) by mouth daily at 6 PM. Pt needs to keep upcoming appt in Dec for further refills 90 tablet 0  . carvedilol (COREG) 25 MG tablet Take 1.5 tablets (37.5 mg total) by mouth 2 (two) times daily. 270 tablet 3  . [facility] ANTISEPTIC SKIN CLEANSER 4 % SOLN APPLY 5ML WITH A DRY TOWEL FOR 2 MINUTES TWICE WEEKLY 237 mL 1  . diltiazem (CARDIZEM CD) 240 MG 24 hr capsule Take 1 capsule (240 mg total) by mouth daily. 90 capsule 3  . dofetilide (TIKOSYN) 500 MCG capsule Take 1 capsule (500 mcg total) by mouth 2 (two) times daily. Please keep upcoming appt with Dr. Lovena Le in December before anymore refills. Thank you 180 capsule 0  . ELIQUIS 5 MG TABS tablet TAKE 1 TABLET BY MOUTH TWICE A DAY 180 tablet 1  . enalapril (VASOTEC) 20 MG tablet Take 1 tablet (20 mg total) by mouth 2 (two) times daily. Please keep upcoming appt with Dr. Lovena Le in December before anymore refills.  Thank you 180 tablet 0  . magnesium oxide (MAG-OX) 400 MG tablet Take 1 tablet (400 mg total) by mouth daily. Please keep upcoming appt in December with Dr. Lovena Le for future refills. Thank you 90 tablet 0  . OVER THE COUNTER MEDICATION Place 1 drop into both eyes daily as needed (dry eyes). Over the counter lubricating eye drop    . potassium chloride SA (KLOR-CON M20) 20 MEQ tablet Take 2 tablets (40 mEq total) by mouth 2 (two) times daily. 360 tablet 3   No current facility-administered medications for this visit.     Past Medical History:  Diagnosis Date  . Atrial fibrillation, persistent ([place])   . Atrial flutter ([place])   . Chronic renal insufficiency    Baseline Cr around 1.2, likely secondary to HTN  . Dilated cardiomyopathy ([place])    Hx of-resolves on echo 2008-EF 60-70%  . Hyperlipidemia   . Hypertension   . Left ventricular dysfunction    ejection fraction 25-30% by 2-D echo  . Nephrolithiasis 2012   Calcium oxalate stones    ROS:   All systems reviewed and negative except as noted in the HPI.   Past Surgical History:  Procedure Laterality Date  . CARDIOVERSION N/A 07/01/2015   Procedure: CARDIOVERSION;  Surgeon: Sanda Klein, MD;  Location: [facility] ENDOSCOPY;  Service: Cardiovascular;  Laterality: N/A;  . TEE WITHOUT CARDIOVERSION  N/A 07/01/2015   Procedure: TRANSESOPHAGEAL ECHOCARDIOGRAM (TEE);  Surgeon: Sanda Klein, MD;  Location: [facility] ENDOSCOPY;  Service: Cardiovascular;  Laterality: N/A;  . WISDOM TOOTH EXTRACTION       Family History  Problem Relation Age of Onset  . Prostate cancer Father   . Colon cancer Neg Hx      Social History   Socioeconomic History  . Marital status: Married    Spouse name: Not on file  . Number of children: Not on file  . Years of education: Not on file  . Highest education level: Not on file  Occupational History  . Not on file  Tobacco Use  . Smoking status: Never Smoker  . Smokeless tobacco: Never Used  Substance and  Sexual Activity  . Alcohol use: No    Alcohol/week: 0.0 standard drinks  . Drug use: No  . Sexual activity: Not on file  Other Topics Concern  . Not on file  Social History Narrative   Married, 2 daughters ages 4,20 (2012). Exercises regularly.   Social Determinants of Health   Financial Resource Strain:   . Difficulty of Paying Living Expenses: Not on file  Food Insecurity:   . Worried About Charity fundraiser in the Last Year: Not on file  . Ran Out of Food in the Last Year: Not on file  Transportation Needs:   . Lack of Transportation (Medical): Not on file  . Lack of Transportation (Non-Medical): Not on file  Physical Activity:   . Days of Exercise per Week: Not on file  . Minutes of Exercise per Session: Not on file  Stress:   . Feeling of Stress : Not on file  Social Connections:   . Frequency of Communication with Friends and Family: Not on file  . Frequency of Social Gatherings with Friends and Family: Not on file  . Attends Religious Services: Not on file  . Active Member of Clubs or Organizations: Not on file  . Attends Archivist Meetings: Not on file  . Marital Status: Not on file  Intimate Partner Violence:   . Fear of Current or Ex-Partner: Not on file  . Emotionally Abused: Not on file  . Physically Abused: Not on file  . Sexually Abused: Not on file     BP (!) 162/100   Pulse 62   Ht 5\' 10"  (1.778 m)   Wt 237 lb (107.5 kg)   SpO2 98%   BMI 34.01 kg/m   Physical Exam:  Well appearing NAD HEENT: Unremarkable Neck:  No JVD, no thyromegally Lymphatics:  No adenopathy Back:  No CVA tenderness Lungs:  Clear with no wheezes HEART:  Regular rate rhythm, no murmurs, no rubs, no clicks Abd:  soft, positive bowel sounds, no organomegally, no rebound, no guarding Ext:  2 plus pulses, no edema, no cyanosis, no clubbing Skin:  No rashes no nodules Neuro:  CN II through XII intact, motor grossly intact  EKG - nsr   Assess/Plan: 1. PAf -  he is maintaining NSR. No change in meds. Continue dofetilide 2. LV dysfunction - he has class 1 symptoms and I suspect that his EF has normalized. He will continue his current meds. 3. HTN - his BP was 140/90 on my check. He states that he was nervous when he came in. 4. Obesity - he has lost some weight. I asked him to lose 10-15 more pounds.  Mikle Bosworth.D.

## 2018-12-31 ENCOUNTER — Other Ambulatory Visit: Payer: Self-pay | Admitting: Cardiology

## 2018-12-31 MED ORDER — CARVEDILOL 25 MG PO TABS: 37.5000 mg | ORAL_TABLET | Freq: Two times a day (BID) | ORAL | 3 refills | Status: DC

## 2019-01-10 ENCOUNTER — Other Ambulatory Visit: Payer: Self-pay | Admitting: Internal Medicine

## 2019-01-23 ENCOUNTER — Telehealth: Payer: Self-pay

## 2019-01-23 NOTE — Telephone Encounter (Signed)
**Note De-Identified [name] Obfuscation** I started a Dofetilide PA through covermymeds. Key: [id number]

## 2019-01-24 NOTE — Telephone Encounter (Signed)
**Note De-Identified Robert White Obfuscation** Letter received from [facility] stating that they have approved the pts Dofetilide PA. Approval good from 01/23/2019 until 01/23/2020.  I have [facility] of this [facility].

## 2019-01-25 ENCOUNTER — Other Ambulatory Visit: Payer: Self-pay | Admitting: Internal Medicine

## 2019-02-03 ENCOUNTER — Other Ambulatory Visit: Payer: Self-pay | Admitting: Internal Medicine

## 2019-02-19 ENCOUNTER — Other Ambulatory Visit: Payer: Self-pay | Admitting: Internal Medicine

## 2019-02-21 NOTE — Telephone Encounter (Signed)
Pt's pharmacy is requesting a refill on magnesium oxide. Would Dr. Lovena Le like to refill this medication? Please address

## 2019-06-15 ENCOUNTER — Other Ambulatory Visit: Payer: Self-pay | Admitting: Internal Medicine

## 2019-06-15 NOTE — Telephone Encounter (Signed)
Prescription refill request for Eliquis received.  Last office visit: Lovena Le 12/30/2018 Scr: 1.49, 08/01/2018 Age:  54 y.o. Weight: 107.5 kg   Prescription refill sent.

## 2019-10-19 ENCOUNTER — Other Ambulatory Visit: Payer: Self-pay | Admitting: Internal Medicine

## 2019-10-19 NOTE — Telephone Encounter (Signed)
Pt called back scheduled appt to see Dr Lovena Le on 01/16/20.  Placed note on follow-up appt to check BMP and CBC at OV follow-up Eliquis labwork.  Will refill rx to get pt to [facility] with Dr Lovena Le on 01/16/20.

## 2019-10-19 NOTE — Telephone Encounter (Signed)
Eliquis 5mg  refill request received. Patient is 54 years old, weight-107.5kg, Crea-1.49 on 08/01/2018-NEEDS LABS, Diagnosis-Aflutter, and last seen by Dr. Lovena Le on 12/30/2018. Dose is appropriate based on dosing criteria but needs labs.   Left a message for the pt to call back; need to schedule labs.

## 2019-12-08 ENCOUNTER — Ambulatory Visit (INDEPENDENT_AMBULATORY_CARE_PROVIDER_SITE_OTHER): Payer: 59 | Admitting: Internal Medicine

## 2019-12-08 ENCOUNTER — Other Ambulatory Visit: Payer: Self-pay | Admitting: Internal Medicine

## 2019-12-08 ENCOUNTER — Encounter: Payer: Self-pay | Admitting: Internal Medicine

## 2019-12-08 VITALS — BP 164/96 | HR 74 | Temp 98.2°F | Wt 241.2 lb

## 2019-12-08 DIAGNOSIS — I4819 Other persistent atrial fibrillation: Secondary | ICD-10-CM

## 2019-12-08 DIAGNOSIS — M25512 Pain in left shoulder: Secondary | ICD-10-CM | POA: Diagnosis not present

## 2019-12-08 DIAGNOSIS — Z131 Encounter for screening for diabetes mellitus: Secondary | ICD-10-CM | POA: Diagnosis not present

## 2019-12-08 DIAGNOSIS — I10 Essential (primary) hypertension: Secondary | ICD-10-CM | POA: Diagnosis not present

## 2019-12-08 DIAGNOSIS — E785 Hyperlipidemia, unspecified: Secondary | ICD-10-CM | POA: Diagnosis not present

## 2019-12-08 LAB — POCT GLYCOSYLATED HEMOGLOBIN (HGB A1C): Hemoglobin A1C: 4.6 % (ref 4.0–5.6)

## 2019-12-08 LAB — GLUCOSE, CAPILLARY: Glucose-Capillary: 91 mg/dL (ref 70–99)

## 2019-12-08 MED ORDER — DICLOFENAC SODIUM 1 % EX GEL: 2.0000 g | Freq: Four times a day (QID) | CUTANEOUS | 1 refills | Status: DC

## 2019-12-08 MED ORDER — SPIRONOLACTONE 25 MG PO TABS: 25.0000 mg | ORAL_TABLET | Freq: Every day | ORAL | 0 refills | Status: DC

## 2019-12-08 MED ORDER — ATORVASTATIN CALCIUM 20 MG PO TABS: 20.0000 mg | ORAL_TABLET | Freq: Every day | ORAL | 3 refills | Status: DC

## 2019-12-08 MED ORDER — OLMESARTAN MEDOXOMIL 40 MG PO TABS: 40.0000 mg | ORAL_TABLET | Freq: Every day | ORAL | 3 refills | Status: DC

## 2019-12-08 NOTE — Progress Notes (Signed)
   CC: high blood pressure, screening for diabetes, and left shoulder pain  HPI:Robert White is a 54 y.o. male who presents for evaluation of hypertension, screening for diabtes, hyperlipidemia and left shoulder pain. Please see individual problem based A/P for details.   Past Medical History:  Diagnosis Date  . Atrial fibrillation, persistent ([place])   . Atrial flutter ([place])   . Chronic renal insufficiency    Baseline Cr around 1.2, likely secondary to HTN  . Dilated cardiomyopathy ([place])    Hx of-resolves on echo 2008-EF 60-70%  . Hyperlipidemia   . Hypertension   . Left ventricular dysfunction    ejection fraction 25-30% by 2-D echo  . Nephrolithiasis 2012   Calcium oxalate stones   Review of Systems:   Review of Systems  Constitutional: Negative for chills and fever.  Respiratory: Negative for cough and shortness of breath.   Cardiovascular: Negative for chest pain and leg swelling.  Psychiatric/Behavioral: Negative for depression. The patient is not nervous/anxious.      Physical Exam: Vitals:   12/08/19 1316  BP: (!) 164/96  Pulse: 74  Temp: 98.2 F (36.8 C)  TempSrc: Oral  SpO2: 100%  Weight: 241 lb 3.2 oz (109.4 kg)    BP Readings from Last 3 Encounters:  12/08/19 (!) 164/96  12/30/18 (!) 162/100  11/16/18 (!) 153/99    General: NAD, nl appearance HEENT: Normocephalic, atraumatic , Conjunctiva nl . hypopigmentation on bottom lip Cardiovascular: Normal rate, regular rhythm.  No murmurs, rubs, or gallops Pulmonary : Equal breath sounds, No wheezes, rales, or rhonchi Abdominal: soft, nontender,  bowel sounds present   Assessment & Plan:   See Encounters Tab for problem based charting.  Patient discussed with Dr. Evette Doffing

## 2019-12-08 NOTE — Patient Instructions (Signed)
Thank you, Robert White for allowing [place] to provide your care today. Today we discussed high blood pressure , screening for diabetes, and left shoulder pain. We added a mediation today for blood pressure. I need to check your lab work in one week. I sent Voltaren gel for your shoulder pain.  I have ordered the following labs for you:  Lab Orders  No laboratory test(s) ordered today     Tests ordered today:  none  Referrals ordered today:   Referral Orders  No referral(s) requested today     I have ordered the following medication/changed the following medications:   Stop the following medications: There are no discontinued medications.   Start the following medications: No orders of the defined types were placed in this encounter.    Follow up: 1 week    Remember: Continue to work on low salt diet, exercise, and ultimately weight loss to help with blood pressure.   Should you have any questions or concerns please call the internal medicine clinic at [phone].      Tamsen Snider, M.D. [place]

## 2019-12-09 LAB — BMP8+ANION GAP
Anion Gap: 15 mmol/L (ref 10.0–18.0)
BUN/Creatinine Ratio: 14 (ref 9–20)
BUN: 19 mg/dL (ref 6–24)
CO2: 22 mmol/L (ref 20–29)
Calcium: 9.4 mg/dL (ref 8.7–10.2)
Chloride: 102 mmol/L (ref 96–106)
Creatinine, Ser: 1.34 mg/dL — ABNORMAL HIGH (ref 0.76–1.27)
GFR calc Af Amer: 68 mL/min/{1.73_m2} (ref >59)
GFR calc non Af Amer: 59 mL/min/{1.73_m2} — ABNORMAL LOW (ref >59)
Glucose: 91 mg/dL (ref 65–99)
Potassium: 4.7 mmol/L (ref 3.5–5.2)
Sodium: 139 mmol/L (ref 134–144)

## 2019-12-09 LAB — LIPID PANEL
Chol/HDL Ratio: 4.1 ratio (ref 0.0–5.0)
Cholesterol, Total: 200 mg/dL — ABNORMAL HIGH (ref 100–199)
HDL: 49 mg/dL (ref >39)
LDL Chol Calc (NIH): 138 mg/dL — ABNORMAL HIGH (ref 0–99)
Triglycerides: 72 mg/dL (ref 0–149)
VLDL Cholesterol Cal: 13 mg/dL (ref 5–40)

## 2019-12-09 LAB — MAGNESIUM: Magnesium: 2.2 mg/dL (ref 1.6–2.3)

## 2019-12-10 ENCOUNTER — Encounter: Payer: Self-pay | Admitting: Internal Medicine

## 2019-12-10 DIAGNOSIS — M25512 Pain in left shoulder: Secondary | ICD-10-CM | POA: Insufficient documentation

## 2019-12-10 NOTE — Assessment & Plan Note (Signed)
Patient reports left shoulder pain for 2 weeks.  The pain started after flu vaccine at a pharmacy.  He has had intermittent ear pain since.  He says repetitive motion with his left arm brings on the pain and it seems to go away with rest.  No deformities on inspection, no focal tenderness on palpation and no weakness. Assessment:  Acute pain of left shoulder Plan: - diclofenac Sodium (VOLTAREN) 1 % GEL; Apply 2 g topically 4 (four) times daily.  Dispense: 100 g; Refill: 1 -If no improvement in 10-14 days recommend patient  return for consideration of intra-articular steroid injection

## 2019-12-10 NOTE — Assessment & Plan Note (Signed)
Lipid Panel     Component Value Date/Time   CHOL 200 (H) 12/08/2019 1448   TRIG 72 12/08/2019 1448   HDL 49 12/08/2019 1448   CHOLHDL 4.1 12/08/2019 1448   CHOLHDL 3.6 09/23/2010 0853   VLDL 21 09/23/2010 0853   LDLCALC 138 (H) 12/08/2019 1448   LABVLDL 13 12/08/2019 1448     The 10-year ASCVD risk score Mikey Bussing DC Jr., et al., 2013) is: 17.1%   Values used to calculate the score:     Age: 54 years     Sex: Male     Is Non-Hispanic African American: Yes     Diabetic: No     Tobacco smoker: No     Systolic Blood Pressure: 952 mmHg     Is BP treated: Yes     HDL Cholesterol: 49 mg/dL     Total Cholesterol: 200 mg/dL   Patient currently on 20 mg Lipitor daily.  Will discuss increasing Lipitor to moderate intensity.

## 2019-12-10 NOTE — Assessment & Plan Note (Signed)
BP Readings from Last 3 Encounters:  12/08/19 (!) 164/96  12/30/18 (!) 162/100  11/16/18 (!) 153/99   Patient's BP has been elevated subsequent visits.  He is currently on enalapril 20 mg twice daily, carvedilol 37.5 mg twice daily and diltiazem 240 mg daily.  He is also on Tikosyn.  Assessment: Uncontrolled hypertension.  Consider starting HCTZ but not appropriate with being on Tikosyn.  Patient is already on calcium channel blocker, beta-blocker, and ACE.  Patient is also taking potassium supplementation.  Patient is discouraged by pill burden.  Will switch enalapril to olmesartan so patient can take the medication 1 time a day.  Stopping potassium supplementation and starting spironolactone 25 mg.  Plan - BMP8+Anion Gap - olmesartan (BENICAR) 40 MG tablet; Take 1 tablet (40 mg total) by mouth daily.  Dispense: 90 tablet; Refill: 3 -Spironolactone 25 mg daily, patient will follow up next week for repeat lab work -Continue carvedilol 37.5 mg twice daily and diltiazem 240 mg daily

## 2019-12-11 ENCOUNTER — Telehealth: Payer: Self-pay | Admitting: Internal Medicine

## 2019-12-11 DIAGNOSIS — E785 Hyperlipidemia, unspecified: Secondary | ICD-10-CM

## 2019-12-11 MED ORDER — ATORVASTATIN CALCIUM 40 MG PO TABS: 40.0000 mg | ORAL_TABLET | Freq: Every day | ORAL | 3 refills | Status: DC

## 2019-12-11 NOTE — Progress Notes (Signed)
Internal Medicine Clinic Attending  Case discussed with Dr. Steen  At the time of the visit.  We reviewed the resident's history and exam and pertinent patient test results.  I agree with the assessment, diagnosis, and plan of care documented in the resident's note.  

## 2019-12-11 NOTE — Telephone Encounter (Signed)
Called and reviewed recent lab work with patient.  His LDL is elevated and he is taking Lipitor 20 mg daily.  Will increase to 40 mg daily.   He has been monitoring his blood pressure at home since recent medication changes and reports systolic blood pressure 803-212 since. He is scheduled to come in tomorrow morning for repeat blood work since starting spironolactone.  His shoulder pain is improving with Voltaren gel .

## 2019-12-12 ENCOUNTER — Other Ambulatory Visit: Payer: Self-pay

## 2019-12-12 ENCOUNTER — Other Ambulatory Visit (INDEPENDENT_AMBULATORY_CARE_PROVIDER_SITE_OTHER): Payer: 59

## 2019-12-12 DIAGNOSIS — I10 Essential (primary) hypertension: Secondary | ICD-10-CM | POA: Diagnosis not present

## 2019-12-13 ENCOUNTER — Telehealth: Payer: Self-pay | Admitting: Internal Medicine

## 2019-12-13 LAB — BMP8+ANION GAP
Anion Gap: 18 mmol/L (ref 10.0–18.0)
BUN/Creatinine Ratio: 16 (ref 9–20)
BUN: 23 mg/dL (ref 6–24)
CO2: 22 mmol/L (ref 20–29)
Calcium: 8.9 mg/dL (ref 8.7–10.2)
Chloride: 104 mmol/L (ref 96–106)
Creatinine, Ser: 1.4 mg/dL — ABNORMAL HIGH (ref 0.76–1.27)
GFR calc Af Amer: 65 mL/min/{1.73_m2} (ref >59)
GFR calc non Af Amer: 56 mL/min/{1.73_m2} — ABNORMAL LOW (ref >59)
Glucose: 98 mg/dL (ref 65–99)
Potassium: 5 mmol/L (ref 3.5–5.2)
Sodium: 144 mmol/L (ref 134–144)

## 2019-12-13 NOTE — Telephone Encounter (Signed)
Called and update patient on result of BMP after starting spirolactone for HTN. Will continue spirolactone at 25 mg dose at this time. His potassium is 5 and SBP <140. He has an appointment with cardiology, Dr. Crissie Sickles in 1 month and I recommended having his electrolytes checked then.   BMP Latest Ref Rng & Units 12/12/2019 12/08/2019 08/01/2018  Glucose 65 - 99 mg/dL 98 91 93  BUN 6 - 24 mg/dL 23 19 19   Creatinine 0.76 - 1.27 mg/dL 1.40(H) 1.34(H) 1.49(H)  BUN/Creat Ratio 9 - 20 16 14 13   Sodium 134 - 144 mmol/L 144 139 140  Potassium 3.5 - 5.2 mmol/L 5.0 4.7 4.5  Chloride 96 - 106 mmol/L 104 102 102  CO2 20 - 29 mmol/L 22 22 21   Calcium 8.7 - 10.2 mg/dL 8.9 9.4 9.3

## 2019-12-20 ENCOUNTER — Other Ambulatory Visit: Payer: Self-pay | Admitting: Internal Medicine

## 2019-12-21 ENCOUNTER — Other Ambulatory Visit: Payer: Self-pay | Admitting: Internal Medicine

## 2019-12-21 ENCOUNTER — Other Ambulatory Visit: Payer: Self-pay

## 2019-12-21 MED ORDER — CARVEDILOL 25 MG PO TABS
37.5000 mg | ORAL_TABLET | Freq: Two times a day (BID) | ORAL | 0 refills | Status: DC
Start: 2019-12-21 — End: 2019-12-22

## 2019-12-28 ENCOUNTER — Telehealth: Payer: Self-pay

## 2019-12-28 NOTE — Telephone Encounter (Signed)
Uploaded clinical information for continued coverage of Dofetilide for [facility] [employer] through CoverMyMeds.   Key: [id number]

## 2019-12-31 ENCOUNTER — Other Ambulatory Visit: Payer: Self-pay | Admitting: Internal Medicine

## 2020-01-01 NOTE — Telephone Encounter (Signed)
PA Clinical questions form has been completed and placed in Dr Forde Dandy box for signature. Will fax back as soon as I get his signature.

## 2020-01-02 NOTE — Telephone Encounter (Signed)
Form has been signed by Dr Lovena Le and faxed back.

## 2020-01-02 NOTE — Telephone Encounter (Signed)
Received fax stating that Dofetilide has been approved 01/02/2020 - 01/01/2021 as long as he remains covered by the prescription drug plan and there are no changes to his plan benefits.

## 2020-01-03 ENCOUNTER — Other Ambulatory Visit: Payer: Self-pay | Admitting: Internal Medicine

## 2020-01-16 ENCOUNTER — Ambulatory Visit: Payer: 59 | Admitting: Internal Medicine

## 2020-01-26 ENCOUNTER — Other Ambulatory Visit: Payer: Self-pay | Admitting: Internal Medicine

## 2020-01-30 ENCOUNTER — Other Ambulatory Visit: Payer: Self-pay

## 2020-01-30 ENCOUNTER — Encounter: Payer: Self-pay | Admitting: Internal Medicine

## 2020-01-30 ENCOUNTER — Ambulatory Visit (INDEPENDENT_AMBULATORY_CARE_PROVIDER_SITE_OTHER): Payer: 59 | Admitting: Internal Medicine

## 2020-01-30 VITALS — BP 142/90 | HR 61 | Ht 70.0 in | Wt 244.2 lb

## 2020-01-30 DIAGNOSIS — I519 Heart disease, unspecified: Secondary | ICD-10-CM

## 2020-01-30 DIAGNOSIS — I4819 Other persistent atrial fibrillation: Secondary | ICD-10-CM | POA: Diagnosis not present

## 2020-01-30 DIAGNOSIS — I10 Essential (primary) hypertension: Secondary | ICD-10-CM

## 2020-01-30 NOTE — Patient Instructions (Addendum)
Medication Instructions:  Your physician recommends that you continue on your current medications as directed. Please refer to the Current Medication list given to you today.  Labwork: You will get a CBC today  Testing/Procedures: None ordered.  Follow-Up: Your physician wants you to follow-up in: one year with Cristopher Peru, MD or one of the following Advanced Practice Providers on your designated Care Team:    Chanetta Marshall, NP  Tommye Standard, PA-C  Legrand Como "Jonni Sanger" Chalmers Cater, [place]  Any Other Special Instructions Will Be Listed Below (If Applicable).  If you need a refill on your cardiac medications before your next appointment, please call your pharmacy.

## 2020-01-30 NOTE — Progress Notes (Signed)
HPI Robert White returns today for followup. He is a pleasant 55 yo man with a tachy induced CM, chronic systolic heart failure, PAF, and HTN. He remains active and is very busy working 2 jobs. He has not had symptomatic atrial fib. No syncope or chest pain. No sob. His bp's have been reviewed and look good. He admits to some dietary indiscretion. Allergies  Allergen Reactions  . Cephalexin Swelling  . Penicillins Swelling    Lip swelling Has patient had a PCN reaction causing immediate rash, facial/tongue/throat swelling, SOB or lightheadedness with hypotension: Yes Has patient had a PCN reaction causing severe rash involving mucus membranes or skin necrosis: No Has patient had a PCN reaction that required hospitalization No Has patient had a PCN reaction occurring within the last 10 years: Yes If all of the above answers are "NO", then may proceed with Cephalosporin use.     Current Outpatient Medications  Medication Sig Dispense Refill  . acetaminophen (TYLENOL) 500 MG tablet Take 500 mg by mouth every 6 (six) hours as needed for mild pain.    [name] apixaban (ELIQUIS) 5 MG TABS tablet Take 1 tablet (5 mg total) by mouth 2 (two) times daily. Overdue for labwork, Needs labwork for future refills. 180 tablet 0  . atorvastatin (LIPITOR) 40 MG tablet Take 1 tablet (40 mg total) by mouth daily. 90 tablet 3  . carvedilol (COREG) 25 MG tablet Take 1.5 tablets (37.5 mg total) by mouth 2 (two) times daily. Please keep upcoming appt in January with Dr. Lovena Le for future refills. Thank you 90 tablet 0  . [facility] ANTISEPTIC SKIN CLEANSER 4 % SOLN APPLY 5ML WITH A DRY TOWEL FOR 2 MINUTES TWICE WEEKLY 237 mL 1  . diclofenac Sodium (VOLTAREN) 1 % GEL Apply 2 g topically 4 (four) times daily. 100 g 1  . diltiazem (CARDIZEM CD) 240 MG 24 hr capsule Take 1 capsule (240 mg total) by mouth daily. Please keep upcoming appt in December with Dr. Lovena Le before anymore refills. Thank you 90 capsule 0  . dofetilide  (TIKOSYN) 500 MCG capsule Take 1 capsule (500 mcg total) by mouth 2 (two) times daily. Please keep upcoming appt in January 2022 with Dr. Lovena Le for future refills. Thank you 180 capsule 0  . magnesium oxide (MAG-OX) 400 MG tablet Take 1 tablet (400 mg total) by mouth daily. 90 tablet 3  . olmesartan (BENICAR) 40 MG tablet Take 1 tablet (40 mg total) by mouth daily. 90 tablet 3  . OVER THE COUNTER MEDICATION Place 1 drop into both eyes daily as needed (dry eyes). Over the counter lubricating eye drop    . spironolactone (ALDACTONE) 25 MG tablet TAKE 1 TABLET BY MOUTH EVERY DAY 90 tablet 0   No current facility-administered medications for this visit.     Past Medical History:  Diagnosis Date  . Atrial fibrillation, persistent ([place])   . Atrial flutter ([place])   . Chronic renal insufficiency    Baseline Cr around 1.2, likely secondary to HTN  . Dilated cardiomyopathy ([place])    Hx of-resolves on echo 2008-EF 60-70%  . Hyperlipidemia   . Hypertension   . Left ventricular dysfunction    ejection fraction 25-30% by 2-D echo  . Nephrolithiasis 2012   Calcium oxalate stones    ROS:   All systems reviewed and negative except as noted in the HPI.   Past Surgical History:  Procedure Laterality Date  . CARDIOVERSION N/A 07/01/2015   Procedure:  CARDIOVERSION;  Surgeon: Sanda Klein, MD;  Location: [facility] ENDOSCOPY;  Service: Cardiovascular;  Laterality: N/A;  . TEE WITHOUT CARDIOVERSION N/A 07/01/2015   Procedure: TRANSESOPHAGEAL ECHOCARDIOGRAM (TEE);  Surgeon: Sanda Klein, MD;  Location: [facility] ENDOSCOPY;  Service: Cardiovascular;  Laterality: N/A;  . WISDOM TOOTH EXTRACTION       Family History  Problem Relation Age of Onset  . Prostate cancer Father   . Colon cancer Neg Hx      Social History   Socioeconomic History  . Marital status: Married    Spouse name: Not on file  . Number of children: Not on file  . Years of education: Not on file  . Highest education level: Not on file   Occupational History  . Not on file  Tobacco Use  . Smoking status: Never Smoker  . Smokeless tobacco: Never Used  Substance and Sexual Activity  . Alcohol use: No    Alcohol/week: 0.0 standard drinks  . Drug use: No  . Sexual activity: Not on file  Other Topics Concern  . Not on file  Social History Narrative   Married, 2 daughters ages 62,20 (2012). Exercises regularly.   Social Determinants of Health   Financial Resource Strain: Not on file  Food Insecurity: Not on file  Transportation Needs: Not on file  Physical Activity: Not on file  Stress: Not on file  Social Connections: Not on file  Intimate Partner Violence: Not on file     BP (!) 142/90   Pulse 61   Ht 5\' 10"  (1.778 m)   Wt 244 lb 3.2 oz (110.8 kg)   SpO2 97%   BMI 35.04 kg/m   Physical Exam:  Well appearing NAD HEENT: Unremarkable Neck:  No JVD, no thyromegally Lymphatics:  No adenopathy Back:  No CVA tenderness Lungs:  Clear HEART:  Regular rate rhythm, no murmurs, no rubs, no clicks Abd:  soft, positive bowel sounds, no organomegally, no rebound, no guarding Ext:  2 plus pulses, no edema, no cyanosis, no clubbing Skin:  No rashes no nodules Neuro:  CN II through XII intact, motor grossly intact  EKG - nsr   Assess/Plan: 1. PAF - he is maintaining NSR. No change in meds. Continue dofetilide 2. LV dysfunction - he has class 1 symptoms and I suspect that his EF has normalized. He will continue his current meds. 3. HTN - his BP has been much better at home though a little high here. 4. Obesity - he has not lost weight. I asked him to lose 10 pounds.  Robert Overlie Tzvi Economou,MD

## 2020-01-31 LAB — CBC WITH DIFFERENTIAL/PLATELET
Basophils Absolute: 0 10*3/uL (ref 0.0–0.2)
Basos: 1 %
EOS (ABSOLUTE): 0.1 10*3/uL (ref 0.0–0.4)
Eos: 1 %
Hematocrit: 49.1 % (ref 37.5–51.0)
Hemoglobin: 16.1 g/dL (ref 13.0–17.7)
Immature Grans (Abs): 0 10*3/uL (ref 0.0–0.1)
Immature Granulocytes: 0 %
Lymphocytes Absolute: 1.7 10*3/uL (ref 0.7–3.1)
Lymphs: 28 %
MCH: 29.9 pg (ref 26.6–33.0)
MCHC: 32.8 g/dL (ref 31.5–35.7)
MCV: 91 fL (ref 79–97)
Monocytes Absolute: 0.5 10*3/uL (ref 0.1–0.9)
Monocytes: 9 %
Neutrophils Absolute: 3.5 10*3/uL (ref 1.4–7.0)
Neutrophils: 61 %
Platelets: 229 10*3/uL (ref 150–450)
RBC: 5.38 x10E6/uL (ref 4.14–5.80)
RDW: 12.1 % (ref 11.6–15.4)
WBC: 5.8 10*3/uL (ref 3.4–10.8)

## 2020-03-04 ENCOUNTER — Other Ambulatory Visit: Payer: Self-pay

## 2020-03-04 MED ORDER — DILTIAZEM HCL ER COATED BEADS 240 MG PO CP24
240.0000 mg | ORAL_CAPSULE | Freq: Every day | ORAL | 3 refills | Status: DC
Start: 2020-03-04 — End: 2021-04-07

## 2020-03-14 ENCOUNTER — Encounter: Payer: 59 | Admitting: Internal Medicine

## 2020-03-16 ENCOUNTER — Other Ambulatory Visit: Payer: Self-pay | Admitting: Internal Medicine

## 2020-03-16 DIAGNOSIS — E785 Hyperlipidemia, unspecified: Secondary | ICD-10-CM

## 2020-03-18 NOTE — Telephone Encounter (Signed)
Next appt scheduled 05/23/20 with PCP.

## 2020-03-19 NOTE — Telephone Encounter (Signed)
Pt last saw Dr Lovena Le 01/30/20, last labs 12/12/19 Creat 1.40, age 55, weight 110.8kg, based on specified criteria pt is on appropriate dosage of Eliquis 5mg  BID for afib.  Will refill rx.

## 2020-03-22 ENCOUNTER — Other Ambulatory Visit: Payer: Self-pay | Admitting: Internal Medicine

## 2020-03-22 ENCOUNTER — Telehealth: Payer: Self-pay

## 2020-03-22 NOTE — Telephone Encounter (Signed)
**Note De-Identified Salvador Coupe Obfuscation** Eliquis PA started through [employer]. Key: [id number]

## 2020-03-26 NOTE — Telephone Encounter (Signed)
Appeal for Robert White has been submitted. He previously experienced hematuria on Xarelto and was subsequently changed to Robert White in 2017.

## 2020-03-26 NOTE — Telephone Encounter (Signed)
**Note De-Identified Robert White Obfuscation** Letter received from [facility] stating that they denied the pts Eliquis PA because he has to try and fail Xarelto prior to taking Eliquis.  I have forwarded th e-mailed the denial letter to [employer] for appeal.

## 2020-03-27 ENCOUNTER — Telehealth: Payer: Self-pay

## 2020-03-27 NOTE — Telephone Encounter (Signed)
Prior Approval appeal request approved for Eliquis 5 mg tablets. PA# [id number] [facility] [id number]  03/26/2020-03/26/2021  AB, CMA

## 2020-03-28 NOTE — Telephone Encounter (Signed)
**Note De-Identified Breta Demedeiros Obfuscation** Letter received from [place] stating that they have approved the pts Eliquis PA appeal. Approval is valid from 03/26/2020-03/26/2021  I have notified [facility] of this approval.

## 2020-04-23 ENCOUNTER — Other Ambulatory Visit: Payer: Self-pay | Admitting: Internal Medicine

## 2020-04-23 DIAGNOSIS — I4819 Other persistent atrial fibrillation: Secondary | ICD-10-CM

## 2020-05-23 ENCOUNTER — Encounter: Payer: 59 | Admitting: Internal Medicine

## 2020-05-24 ENCOUNTER — Other Ambulatory Visit: Payer: Self-pay | Admitting: Internal Medicine

## 2020-07-20 ENCOUNTER — Other Ambulatory Visit: Payer: Self-pay | Admitting: Internal Medicine

## 2020-07-20 DIAGNOSIS — I4819 Other persistent atrial fibrillation: Secondary | ICD-10-CM

## 2020-07-23 NOTE — Telephone Encounter (Signed)
Prescription refill request for Eliquis received. Indication: Aflutter Last office visit: Lovena Le 01/30/2020 Scr: 1.4, 12/12/2019 Age: 55 yo  Weight:  110.8 kg   Pt is on the correct dose of Eliquis per dosing criteria, prescription refill sent for Eliquis 5mg  BID.

## 2020-09-12 ENCOUNTER — Other Ambulatory Visit: Payer: Self-pay | Admitting: Student

## 2020-10-09 ENCOUNTER — Encounter: Payer: 59 | Admitting: Internal Medicine

## 2020-10-12 DIAGNOSIS — I5032 Chronic diastolic (congestive) heart failure: Secondary | ICD-10-CM | POA: Insufficient documentation

## 2020-10-12 DIAGNOSIS — I5022 Chronic systolic (congestive) heart failure: Secondary | ICD-10-CM | POA: Insufficient documentation

## 2020-10-12 NOTE — Progress Notes (Signed)
   Office Visit   Patient ID: Robert White, male    DOB: 1966-01-02, 55 y.o.   MRN: [id number]   PCP: [name], MD   Subjective:  Robert White is a 55 y.o. year old male who presents for follow up of chronic medical conditions including hypertension, CHF, and PAF. Please refer to problem based charting for assessment and plan.    Objective:   BP 131/83 (BP Location: Left Arm, Patient Position: Sitting, Cuff Size: Normal)   Pulse 67   Temp 97.7 F (36.5 C) (Oral)   Ht 5\' 10"  (1.778 m)   Wt 242 lb (109.8 kg)   SpO2 100%   BMI 34.72 kg/m  BP Readings from Last 3 Encounters:  10/14/20 131/83  01/30/20 (!) 142/90  12/08/19 (!) 164/96   General: well appearing male, no distress Cardiac: RRR, no lower extremity edema Pulm: lungs clear throughout  Assessment & Plan:   Problem List Items Addressed This Visit       Cardiovascular and Mediastinum   Essential hypertension (Chronic)    Blood pressure is at goal today.  Continue current management with olmesartan 40mg  daily, spironolactone 25mg  daily, coreg 37.5mg  BID, cardizem CD 240mg  daily BMP today      Relevant Medications   atorvastatin (LIPITOR) 40 MG tablet   olmesartan (BENICAR) 40 MG tablet   spironolactone (ALDACTONE) 25 MG tablet   Other Relevant Orders   Basic metabolic panel   Persistent atrial fibrillation (HCC) (Chronic)    In SR today. HR 67 Continue to follow with Dr. Lovena Le Continue diltiazem, coreg, tikosyn, eliquis      Relevant Medications   atorvastatin (LIPITOR) 40 MG tablet   olmesartan (BENICAR) 40 MG tablet   spironolactone (ALDACTONE) 25 MG tablet     Genitourinary   Chronic renal insufficiency    BMP today        Other   Hyperlipidemia    LDL 138 last November at which time lipitor was increased 20mg >40mg .  Repeat lipid panel at next office visit. Continue lipitor      Relevant Medications   atorvastatin (LIPITOR) 40 MG tablet   olmesartan (BENICAR) 40 MG tablet    spironolactone (ALDACTONE) 25 MG tablet   Preventative health care    He is aware of need for colonoscopy and has agreed to contact them to arrange. Influenza vaccination provided today        Return in about 6 months (around 04/23/2021) for Blood pressure follow up.   Pt discussed with Dr. Venetia Maxon, MD Internal Medicine Resident PGY-3 [facility] 10/14/2020 9:22 AM

## 2020-10-14 ENCOUNTER — Other Ambulatory Visit: Payer: Self-pay

## 2020-10-14 ENCOUNTER — Ambulatory Visit (INDEPENDENT_AMBULATORY_CARE_PROVIDER_SITE_OTHER): Payer: 59 | Admitting: Internal Medicine

## 2020-10-14 DIAGNOSIS — I5022 Chronic systolic (congestive) heart failure: Secondary | ICD-10-CM

## 2020-10-14 DIAGNOSIS — Z0000 Encounter for general adult medical examination without abnormal findings: Secondary | ICD-10-CM

## 2020-10-14 DIAGNOSIS — I10 Essential (primary) hypertension: Secondary | ICD-10-CM

## 2020-10-14 DIAGNOSIS — N189 Chronic kidney disease, unspecified: Secondary | ICD-10-CM

## 2020-10-14 DIAGNOSIS — E785 Hyperlipidemia, unspecified: Secondary | ICD-10-CM | POA: Diagnosis not present

## 2020-10-14 DIAGNOSIS — I4819 Other persistent atrial fibrillation: Secondary | ICD-10-CM | POA: Diagnosis not present

## 2020-10-14 MED ORDER — ATORVASTATIN CALCIUM 40 MG PO TABS: 40.0000 mg | ORAL_TABLET | Freq: Every day | ORAL | 3 refills | Status: DC

## 2020-10-14 MED ORDER — SPIRONOLACTONE 25 MG PO TABS: 25.0000 mg | ORAL_TABLET | Freq: Every day | ORAL | 1 refills | Status: DC

## 2020-10-14 MED ORDER — OLMESARTAN MEDOXOMIL 40 MG PO TABS: 40.0000 mg | ORAL_TABLET | Freq: Every day | ORAL | 3 refills | Status: DC

## 2020-10-14 NOTE — Assessment & Plan Note (Signed)
BMP today

## 2020-10-14 NOTE — Assessment & Plan Note (Signed)
In SR today. HR 67  Continue to follow with Dr. Lovena Le  Continue diltiazem, coreg, tikosyn, eliquis

## 2020-10-14 NOTE — Assessment & Plan Note (Signed)
LDL 138 last November at which time lipitor was increased 20mg >40mg .   Repeat lipid panel at next office visit.  Continue lipitor

## 2020-10-14 NOTE — Assessment & Plan Note (Signed)
He is aware of need for colonoscopy and has agreed to contact them to arrange. Influenza vaccination provided today

## 2020-10-14 NOTE — Addendum Note (Signed)
Addended by: Mitzi Hansen on: 10/14/2020 09:35 AM   Modules accepted: Orders

## 2020-10-14 NOTE — Assessment & Plan Note (Signed)
Blood pressure is at goal today.   Continue current management with olmesartan 40mg  daily, spironolactone 25mg  daily, coreg 37.5mg  BID, cardizem CD 240mg  daily  BMP today

## 2020-10-14 NOTE — Patient Instructions (Signed)
[name], it was a pleasure seeing you today!  Today we discussed:  Hypertension: your blood pressure looks good today. -continue your current medications -I have placed refills for olmesartan, spironolactone, and lipitor  Colon cancer screening -Please arrange a colonoscopy at your convenience.   Follow-up: Dr. Court Joy in April 2023  Please make sure to arrive 15 minutes prior to your next appointment. If you arrive late, you may be asked to reschedule.   We look forward to seeing you next time. Please call our clinic at [phone] if you have any questions or concerns. The best time to call is Monday-Friday from 9am-4pm, but there is someone available 24/7. If after hours or the weekend, call the main hospital number and ask for the Internal Medicine Resident On-Call. If you need medication refills, please notify your pharmacy one week in advance and they will send [place] a request.  Thank you for letting us take part in your care. Wishing you the best!

## 2020-10-14 NOTE — Progress Notes (Signed)
Internal Medicine Clinic Attending  Case discussed with Dr. Darrick Meigs  At the time of the visit.  We reviewed the resident's history and exam and pertinent patient test results.  I agree with the assessment, diagnosis, and plan of care documented in the resident's note with the following correction:  Repeating lipid panel today.

## 2020-10-15 LAB — LIPID PANEL
Chol/HDL Ratio: 3.7 ratio (ref 0.0–5.0)
Cholesterol, Total: 137 mg/dL (ref 100–199)
HDL: 37 mg/dL — ABNORMAL LOW (ref >39)
LDL Chol Calc (NIH): 83 mg/dL (ref 0–99)
Triglycerides: 91 mg/dL (ref 0–149)
VLDL Cholesterol Cal: 17 mg/dL (ref 5–40)

## 2020-10-15 LAB — BASIC METABOLIC PANEL
BUN/Creatinine Ratio: 20 (ref 9–20)
BUN: 30 mg/dL — ABNORMAL HIGH (ref 6–24)
CO2: 20 mmol/L (ref 20–29)
Calcium: 9.3 mg/dL (ref 8.7–10.2)
Chloride: 103 mmol/L (ref 96–106)
Creatinine, Ser: 1.53 mg/dL — ABNORMAL HIGH (ref 0.76–1.27)
Glucose: 99 mg/dL (ref 65–99)
Potassium: 5.2 mmol/L (ref 3.5–5.2)
Sodium: 138 mmol/L (ref 134–144)
eGFR: 53 mL/min/{1.73_m2} — ABNORMAL LOW (ref >59)

## 2020-12-02 ENCOUNTER — Telehealth: Payer: Self-pay

## 2020-12-02 NOTE — Telephone Encounter (Signed)
**Note De-Identified Robert White Obfuscation** Dofetilide PA form received from [facility]. I have completed the form and have emailed it to Dr Tanna Furry nurse so she can obtain Dr Tanna Furry signature, date it and to either fax to [place] at the fax number written on the cover letter included or to place in the to be faxed basket in Medical Records to be faxed.

## 2020-12-03 NOTE — Telephone Encounter (Signed)
Form completed and faxed as requested.  Confirmation fax received.

## 2020-12-31 ENCOUNTER — Encounter: Payer: Self-pay | Admitting: *Deleted

## 2021-01-21 ENCOUNTER — Encounter: Payer: Self-pay | Admitting: Internal Medicine

## 2021-01-21 ENCOUNTER — Telehealth: Payer: Self-pay | Admitting: *Deleted

## 2021-01-21 ENCOUNTER — Ambulatory Visit (INDEPENDENT_AMBULATORY_CARE_PROVIDER_SITE_OTHER): Payer: 59 | Admitting: Internal Medicine

## 2021-01-21 VITALS — BP 118/80 | HR 66 | Ht 70.0 in | Wt 256.6 lb

## 2021-01-21 DIAGNOSIS — Z7901 Long term (current) use of anticoagulants: Secondary | ICD-10-CM | POA: Diagnosis not present

## 2021-01-21 DIAGNOSIS — Z86010 Personal history of colonic polyps: Secondary | ICD-10-CM | POA: Diagnosis not present

## 2021-01-21 DIAGNOSIS — I255 Ischemic cardiomyopathy: Secondary | ICD-10-CM

## 2021-01-21 MED ORDER — PLENVU 140 G PO SOLR: 1.0000 | ORAL | 0 refills | Status: DC

## 2021-01-21 NOTE — Telephone Encounter (Signed)
° °  Name: Robert White  DOB: 03-Jan-1966  MRN: [id number]  Primary Cardiologist: Dr. Lovena Le  Chart reviewed as part of pre-operative protocol coverage. Because of [name]'s past medical history and time since last visit, he will require a follow-up visit in order to better assess preoperative cardiovascular risk.  Pre-op covering staff: - Please schedule appointment and call patient to inform them. If patient already had an upcoming appointment within acceptable timeframe, please add "pre-op clearance" to the appointment notes so provider is aware. - Please contact requesting surgeon's office via preferred method (i.e, phone, fax) to inform them of need for appointment prior to surgery.  If applicable, this message will also be routed to pharmacy pool and/or primary cardiologist for input on holding anticoagulant/antiplatelet agent as requested below so that this information is available to the clearing provider at time of patient's appointment.   See note by Dr. Hilarie Fredrickson, will defer to to provider on follow up to decide on repeating echocardiogram. Please earliest follow up with Dr. Lovena Le or EP app, this will allow adequate time to schedule echocardiogram if needed.  [place], [place]  01/21/2021, 10:48 AM

## 2021-01-21 NOTE — Patient Instructions (Signed)
You have been scheduled for a colonoscopy. Please follow written instructions given to you at your visit today.  Please pick up your prep supplies at the pharmacy within the next 1-3 days. If you use inhalers (even only as needed), please bring them with you on the day of your procedure.  If you are age 58 or older, your body mass index should be between 23-30. Your Body mass index is 36.82 kg/m. If this is out of the aforementioned range listed, please consider follow up with your Primary Care Provider.  If you are age 83 or younger, your body mass index should be between 19-25. Your Body mass index is 36.82 kg/m. If this is out of the aformentioned range listed, please consider follow up with your Primary Care Provider.   ________________________________________________________  The [facility] providers would like to encourage you to use [facility] to communicate with providers for non-urgent requests or questions.  Due to long hold times on the telephone, sending your provider a message by [facility] may be a faster and more efficient way to get a response.  Please allow 48 business hours for a response.  Please remember that this is for non-urgent requests.  _______________________________________________________  Due to recent changes in healthcare laws, you may see the results of your imaging and laboratory studies on MyChart before your provider has had a chance to review them.  We understand that in some cases there may be results that are confusing or concerning to you. Not all laboratory results come back in the same time frame and the provider may be waiting for multiple results in order to interpret others.  Please give [place] 48 hours in order for your provider to thoroughly review all the results before contacting the office for clarification of your results.

## 2021-01-21 NOTE — Telephone Encounter (Addendum)
Request for surgical clearance:     Endoscopy Procedure  What type of surgery is being performed?     colonoscopy  When is this surgery scheduled?     03/07/21  What type of clearance is required ?   Pharmacy  Are there any medications that need to be held prior to surgery and how long? Eliquis, 2 days   Practice name and name of physician performing surgery?      [facility]  What is your office phone and fax number?      Phone- [phone]  [phone]  Anesthesia type (None, local, MAC, general) ?       MAC  IN ADDITION:   Jerene Bears, MD  Larina Bras, CMA [place],  We can discuss, however I did not see that his EF has not been updated since 2018.  By the 2018 echocardiogram he would not qualify for [place] colonoscopy.  However, Dr. Lovena Le felt that his EF has likely recovered.  If this is correct then he can proceed with [place] colonoscopy   Options outlined in my note, we can repeat ECHO

## 2021-01-21 NOTE — Telephone Encounter (Signed)
Clinical pharmacist to review Eliquis 

## 2021-01-21 NOTE — Telephone Encounter (Signed)
Patient with diagnosis of A Fib on Eliquis for anticoagulation.    Procedure: colonoscopy Date of procedure: 03/07/21   CHA2DS2-VASc Score = 2  This indicates a 2.2% annual risk of stroke. The patient's score is based upon: CHF History: 1 HTN History: 1 Diabetes History: 0 Stroke History: 0 Vascular Disease History: 0 Age Score: 0 Gender Score: 0   CrCl 68 mL/min Platelet count 229K  Per office protocol, patient can hold Eliquis for 2 days prior to procedure.

## 2021-01-21 NOTE — Progress Notes (Signed)
Patient ID: Robert White, male   DOB: 26-Mar-1965, 56 y.o.   MRN: [id number] HPI: Robert White is a 56 year old male with a history of nonadvanced adenoma of the colon, colonic diverticulosis, atrial fibrillation on dofetilide and Eliquis, prior cardiomyopathy (felt to have recovered per Dr. Lovena Le at his last office visit with the patient in January 2022), history of hypertension and hyperlipidemia who is here to discuss surveillance colonoscopy.  He is here alone today.  He reports that he is feeling well.  He has regular bowel movements, usually once or twice per day.  No blood in stool or melena.  No abdominal pain.  No change in bowel habit.  He denies upper GI and hepatobiliary complaint including heartburn, dyspepsia, nausea, vomiting, dysphagia and odynophagia.  Good appetite.  Stable weight.  He continues to work on a daily basis.  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  Past Medical History:  Diagnosis Date   Atrial fibrillation, persistent (HCC)    Atrial flutter (HCC)    Chronic renal insufficiency    Baseline Cr around 1.2, likely secondary to HTN   Dilated cardiomyopathy ([place])    Hx of-resolves on echo 2008-EF 60-70%   Diverticulosis    Hyperlipidemia    Hypertension    Left ventricular dysfunction    ejection fraction 25-30% by 2-D echo   Nephrolithiasis 01/19/2010   Calcium oxalate stones   Tubular adenoma of colon     Past Surgical History:  Procedure Laterality Date   CARDIOVERSION N/A 07/01/2015   Procedure: CARDIOVERSION;  Surgeon: Sanda Klein, MD;  Location: [facility] ENDOSCOPY;  Service: Cardiovascular;  Laterality: N/A;   TEE WITHOUT CARDIOVERSION N/A 07/01/2015   Procedure: TRANSESOPHAGEAL ECHOCARDIOGRAM (TEE);  Surgeon: Sanda Klein, MD;  Location: [facility] ENDOSCOPY;  Service: Cardiovascular;  Laterality: N/A;   WISDOM TOOTH EXTRACTION      Outpatient Medications Prior to Visit  Medication Sig Dispense Refill   atorvastatin (LIPITOR) 40 MG tablet  Take 1 tablet (40 mg total) by mouth daily. 90 tablet 3   carvedilol (COREG) 25 MG tablet TAKE 1 AND 1/2 TABLETS TWICE A DAY 270 tablet 3   diclofenac Sodium (VOLTAREN) 1 % GEL Apply 2 g topically 4 (four) times daily. 100 g 1   diltiazem (CARDIZEM CD) 240 MG 24 hr capsule Take 1 capsule (240 mg total) by mouth daily. 90 capsule 3   dofetilide (TIKOSYN) 500 MCG capsule TAKE 1 CAPSULE BY MOUTH 2 TIMES DAILY. 180 capsule 2   ELIQUIS 5 MG TABS tablet TAKE 1 TABLET BY MOUTH TWICE A DAY 180 tablet 1   magnesium oxide (MAG-OX) 400 MG tablet TAKE 1 TABLET BY MOUTH EVERY DAY 90 tablet 3   olmesartan (BENICAR) 40 MG tablet Take 1 tablet (40 mg total) by mouth daily. 90 tablet 3   spironolactone (ALDACTONE) 25 MG tablet Take 1 tablet (25 mg total) by mouth daily. 90 tablet 1   No facility-administered medications prior to visit.    Allergies  Allergen Reactions   Cephalexin Swelling   Penicillins Swelling    Lip swelling Has patient had a PCN reaction causing immediate rash, facial/tongue/throat swelling, SOB or lightheadedness with hypotension: Yes Has patient had a PCN reaction causing severe rash involving mucus membranes or skin necrosis: No Has patient had a PCN reaction that required hospitalization No Has patient had a PCN reaction occurring within the last 10 years: Yes If all of the above answers are "NO", then may proceed with Cephalosporin use.  Family History  Problem Relation Age of Onset   Prostate cancer Father    Colon cancer Neg Hx     Social History   Tobacco Use   Smoking status: Never   Smokeless tobacco: Never  Substance Use Topics   Alcohol use: No    Alcohol/week: 0.0 standard drinks   Drug use: No    ROS: As per history of present illness, otherwise negative  BP 118/80    Pulse 66    Ht 5\' 10"  (1.778 m)    Wt 256 lb 9.6 oz (116.4 kg)    BMI 36.82 kg/m  Gen: awake, alert, NAD HEENT: anicteric  CV: RRR, no mrg Pulm: CTA b/l Abd: soft, NT/ND, +BS  throughout Ext: no c/c/e Neuro: nonfocal  RELEVANT LABS AND IMAGING: CBC    Component Value Date/Time   WBC 5.8 01/30/2020 0901   WBC 7.9 06/18/2015 1246   RBC 5.38 01/30/2020 0901   RBC 5.58 06/18/2015 1246   HGB 16.1 01/30/2020 0901   HCT 49.1 01/30/2020 0901   PLT 229 01/30/2020 0901   MCV 91 01/30/2020 0901   MCH 29.9 01/30/2020 0901   MCH 29.2 06/18/2015 1246   MCHC 32.8 01/30/2020 0901   MCHC 32.3 06/18/2015 1246   RDW 12.1 01/30/2020 0901   LYMPHSABS 1.7 01/30/2020 0901   MONOABS 711 06/18/2015 1246   EOSABS 0.1 01/30/2020 0901   BASOSABS 0.0 01/30/2020 0901    CMP     Component Value Date/Time   NA 138 10/14/2020 0907   K 5.2 10/14/2020 0907   CL 103 10/14/2020 0907   CO2 20 10/14/2020 0907   GLUCOSE 99 10/14/2020 0907   GLUCOSE 94 03/13/2016 0341   BUN 30 (H) 10/14/2020 0907   CREATININE 1.53 (H) 10/14/2020 0907   CREATININE 1.24 06/18/2015 1246   CALCIUM 9.3 10/14/2020 0907   PROT 7.2 07/13/2012 0903   ALBUMIN 4.2 07/13/2012 0903   AST 19 07/13/2012 0903   ALT 23 07/13/2012 0903   ALKPHOS 95 07/13/2012 0903   BILITOT 1.2 07/13/2012 0903   GFRNONAA 56 (L) 12/12/2019 0911   GFRNONAA 71 06/20/2014 1443   GFRAA 65 12/12/2019 0911   GFRAA 82 06/20/2014 1443    ASSESSMENT/PLAN: 56 year old male with a history of nonadvanced adenoma of the colon, colonic diverticulosis, atrial fibrillation on dofetilide and Eliquis, prior cardiomyopathy (felt to have recovered per Dr. Lovena Le at his last office visit with the patient in January 2022), history of hypertension and hyperlipidemia who is here to discuss surveillance colonoscopy.  History of adenomatous colon polyp --surveillance colonoscopy due and recommended at this time.  We discussed the risk, benefits and alternatives and he is agreeable and wishes to proceed.  His last exam was nearly 7 years ago.  He is on Eliquis which will need to be held prior to procedure as below. --Colonoscopy for surveillance ([place]  versus outpatient hospital setting) --Will hold Eliquis 2 days prior to endoscopic procedures - will instruct when and how to resume after procedure. Benefits and risks of procedure explained including risks of bleeding, perforation, infection, missed lesions, reactions to medications and possible need for hospitalization and surgery for complications. Additional rare but real risk of stroke or other vascular clotting events off Eliquis also explained and need to seek urgent help if any signs of these problems occur. Will communicate by phone or EMR with patient's  prescribing provider to confirm that holding Eliquis is reasonable in this case.   2.  History of atrial  fibrillation and dilated cardiomyopathy --patient without symptoms of heart failure.  Per Dr. Tanna Furry last cardiology note the patient has "class I symptoms and I suspect that his EF has normalized".  His last echocardiogram shows EF of 25 to 30% though this was 5 years ago.  In order to qualify for colonoscopy in the [facility] his EF needs to be greater than 35%. --2 options regarding where to perform colonoscopy; we could repeat echocardiogram and if EF has indeed recovered as Dr. Lovena Le suspects then colonoscopy can be performed in the [facility].  Without echocardiogram his colonoscopy would need to be moved to the outpatient hospital setting       BT:YOMAY, Jeneen Rinks, Md [address] [place],  [place] [address]

## 2021-01-23 NOTE — Telephone Encounter (Signed)
Pt has appt 02/12/21 with Tommye Standard, PAC for pre op clearance. I will forward notes to [facility] for upcoming appt. Will send FYI to requesting office pt has appt 02/12/21.

## 2021-01-23 NOTE — Telephone Encounter (Signed)
FYI

## 2021-02-09 NOTE — Progress Notes (Signed)
Cardiology Office Note Date:  02/12/2021  Patient ID:  Robert White, Robert White 1965/11/15, MRN [id number] PCP:  [name], MD  Cardiologist:  Dr. Gwenlyn Found Electrophysiologist: Dr. Lovena Le     Chief Complaint: annual visit, pre-op  History of Present Illness: Robert White is a 56 y.o. male with history of HTN, NICM (tachy-mediated), HLD, obesity and Afib  He comes in today to be seen for Dr. Lovena Le, last seen by him Jan 2022, doing well, very active, working 2 jobs, suspected his LVEF had improved.  No changes were made  He is pending a colonoscopy and requires cardiac risk stratification and needs to be off eliquis 2 days  [name] has addressed Eliquis, OK for 2 days.  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 No CP, palpitations or cardiac awareness Reports excellent exertional capacity No dizzy spells, near syncope or syncope No SOB, DOE No bleeding or signs of bleeding  Colonoscopy is for screening    Afib hx Diagnosed 2017 Tikosyn started 2018,  is current    Past Medical History:  Diagnosis Date   Atrial fibrillation, persistent (HCC)    Atrial flutter (HCC)    Chronic renal insufficiency    Baseline Cr around 1.2, likely secondary to HTN   Dilated cardiomyopathy ([place])    Hx of-resolves on echo 2008-EF 60-70%   Diverticulosis    Hyperlipidemia    Hypertension    Left ventricular dysfunction    ejection fraction 25-30% by 2-D echo   Nephrolithiasis 01/19/2010   Calcium oxalate stones   Tubular adenoma of colon     Past Surgical History:  Procedure Laterality Date   CARDIOVERSION N/A 07/01/2015   Procedure: CARDIOVERSION;  Surgeon: Sanda Klein, MD;  Location: [place];  Service: Cardiovascular;  Laterality: N/A;   TEE WITHOUT CARDIOVERSION N/A 07/01/2015   Procedure: TRANSESOPHAGEAL  ECHOCARDIOGRAM (TEE);  Surgeon: Sanda Klein, MD;  Location: [facility] ENDOSCOPY;  Service: Cardiovascular;  Laterality: N/A;   WISDOM TOOTH EXTRACTION      Current Outpatient Medications  Medication Sig Dispense Refill   atorvastatin (LIPITOR) 40 MG tablet Take 1 tablet (40 mg total) by mouth daily. 90 tablet 3   carvedilol (COREG) 25 MG tablet TAKE 1 AND 1/2 TABLETS TWICE A DAY 270 tablet 3   diclofenac Sodium (VOLTAREN) 1 % GEL Apply 2 g topically 4 (four) times daily. 100 g 1   diltiazem (CARDIZEM CD) 240 MG 24 hr capsule Take 1 capsule (240 mg total) by mouth daily. 90 capsule 3   dofetilide (TIKOSYN) 500 MCG capsule TAKE 1 CAPSULE BY MOUTH 2 TIMES DAILY. 180 capsule 2   ELIQUIS 5 MG TABS tablet TAKE 1 TABLET BY MOUTH TWICE A DAY 180 tablet 1   magnesium oxide (MAG-OX) 400 MG tablet TAKE 1 TABLET BY MOUTH EVERY DAY 90 tablet 3   olmesartan (BENICAR) 40 MG tablet Take 1 tablet (40 mg total) by mouth daily. 90 tablet 3   spironolactone (ALDACTONE) 25 MG tablet Take 1 tablet (25 mg total) by mouth daily. 90 tablet 1   No current facility-administered medications for this visit.    Allergies:   Cephalexin and Penicillins   Social History:  The patient  reports that he has never smoked. He has never used smokeless tobacco. He reports that he does not drink alcohol and  does not use drugs.   Family History:  The patient's family history includes Prostate cancer in his father.  ROS:  Please see the history of present illness.    All other systems are reviewed and otherwise negative.   PHYSICAL EXAM:  VS:  BP 124/80    Pulse 61    Ht 5\' 10"  (1.778 m)    Wt 253 lb 12.8 oz (115.1 kg)    SpO2 94%    BMI 36.42 kg/m  BMI: Body mass index is 36.42 kg/m. Well nourished, well developed, in no acute distress HEENT: normocephalic, atraumatic Neck: no JVD, carotid bruits or masses Cardiac:  RRR; no significant murmurs, no rubs, or gallops Lungs:  CTA b/l, no wheezing, rhonchi or rales Abd: soft,  nontender MS: no deformity or atrophy Ext: no edema Skin: warm and dry, no rash Neuro:  No gross deficits appreciated Psych: euthymic mood, full affect    EKG:  Done today and reviewed by myself shows  SR 61bpm, Qtc 473ms  03/03/2016: TTE Study Conclusions  - Left ventricle: The cavity size was moderately dilated. Wall    thickness was increased in a pattern of mild LVH. Systolic    function was severely reduced. The estimated ejection fraction    was in the range of 25% to 30%. Diffuse hypokinesis.  - Aortic root: The aortic root was mildly dilated.  - Mitral valve: There was mild regurgitation.  - Left atrium: The atrium was severely dilated.  - Right ventricle: The cavity size was mildly dilated.  - Right atrium: The atrium was mildly dilated.  - Pulmonary arteries: PA peak pressure: 35 mm Hg (S).   Recent Labs: 10/14/2020: BUN 30; Creatinine, Ser 1.53; Potassium 5.2; Sodium 138  10/14/2020: Chol/HDL Ratio 3.7; Cholesterol, Total 137; HDL 37; LDL Chol Calc (NIH) 83; Triglycerides 91   CrCl cannot be calculated (Patient's most recent lab result is older than the maximum 21 days allowed.).   Wt Readings from Last 3 Encounters:  02/12/21 253 lb 12.8 oz (115.1 kg)  01/21/21 256 lb 9.6 oz (116.4 kg)  10/14/20 242 lb (109.8 kg)     Other studies reviewed: Additional studies/records reviewed today include: summarized above  ASSESSMENT AND PLAN:  Persistent Afib CHA2DS2Vasc is 2, on Eliquis, appropriately dosed 0% burden by symptoms Stable QTc on Tikosyn Update his labs  NICM No symptoms or exam findings of volume OL He says he is not taking aldactone, has not for a very long time (if ever by his memory) Dr. Lovena Le suspected he has had recover of his LVEF, reasonable to update his echo, this does NOT have to be done for his colonoscopy   HTN Looks good  4. Pre-colonoscopy cardiac eval Low risk procedure Low cardiac risk score No cardiac contraindication to the  procedure OK to hold eliquis 2 days ahead, resume post procedure as per GI MD   Disposition: F/u with [place] annually as he has been, sooner if needed.  Current medicines are reviewed at length with the patient today.  The patient did not have any concerns regarding medicines.  Venetia Night, PA-C 02/12/2021 9:07 AM     [facility] [place] [place] [address] [phone] (office)  [phone] (fax)

## 2021-02-10 ENCOUNTER — Other Ambulatory Visit (HOSPITAL_BASED_OUTPATIENT_CLINIC_OR_DEPARTMENT_OTHER): Payer: Self-pay

## 2021-02-12 ENCOUNTER — Ambulatory Visit (INDEPENDENT_AMBULATORY_CARE_PROVIDER_SITE_OTHER): Payer: 59 | Admitting: Physician Assistant

## 2021-02-12 ENCOUNTER — Encounter: Payer: Self-pay | Admitting: Physician Assistant

## 2021-02-12 ENCOUNTER — Other Ambulatory Visit: Payer: Self-pay

## 2021-02-12 VITALS — BP 124/80 | HR 61 | Ht 70.0 in | Wt 253.8 lb

## 2021-02-12 DIAGNOSIS — I4819 Other persistent atrial fibrillation: Secondary | ICD-10-CM

## 2021-02-12 DIAGNOSIS — I10 Essential (primary) hypertension: Secondary | ICD-10-CM

## 2021-02-12 DIAGNOSIS — Z01810 Encounter for preprocedural cardiovascular examination: Secondary | ICD-10-CM | POA: Diagnosis not present

## 2021-02-12 DIAGNOSIS — I429 Cardiomyopathy, unspecified: Secondary | ICD-10-CM

## 2021-02-12 DIAGNOSIS — Z79899 Other long term (current) drug therapy: Secondary | ICD-10-CM

## 2021-02-12 DIAGNOSIS — Z01818 Encounter for other preprocedural examination: Secondary | ICD-10-CM

## 2021-02-12 LAB — BASIC METABOLIC PANEL
BUN/Creatinine Ratio: 18 (ref 9–20)
BUN: 29 mg/dL — ABNORMAL HIGH (ref 6–24)
CO2: 24 mmol/L (ref 20–29)
Calcium: 9.4 mg/dL (ref 8.7–10.2)
Chloride: 102 mmol/L (ref 96–106)
Creatinine, Ser: 1.59 mg/dL — ABNORMAL HIGH (ref 0.76–1.27)
Glucose: 99 mg/dL (ref 70–99)
Potassium: 5.1 mmol/L (ref 3.5–5.2)
Sodium: 137 mmol/L (ref 134–144)
eGFR: 50 mL/min/{1.73_m2} — ABNORMAL LOW (ref >59)

## 2021-02-12 LAB — CBC
Hematocrit: 48.2 % (ref 37.5–51.0)
Hemoglobin: 15.6 g/dL (ref 13.0–17.7)
MCH: 29.9 pg (ref 26.6–33.0)
MCHC: 32.4 g/dL (ref 31.5–35.7)
MCV: 93 fL (ref 79–97)
Platelets: 236 10*3/uL (ref 150–450)
RBC: 5.21 x10E6/uL (ref 4.14–5.80)
RDW: 12.1 % (ref 11.6–15.4)
WBC: 6.6 10*3/uL (ref 3.4–10.8)

## 2021-02-12 LAB — MAGNESIUM: Magnesium: 2.1 mg/dL (ref 1.6–2.3)

## 2021-02-12 NOTE — Patient Instructions (Signed)
Medication Instructions:    Your physician recommends that you continue on your current medications as directed. Please refer to the Current Medication list given to you today.  *If you need a refill on your cardiac medications before your next appointment, please call your pharmacy*   Lab Work:  [place]    If you have labs (blood work) drawn today and your tests are completely normal, you will receive your results only by: [place] (if you have MyChart) OR A paper copy in the mail If you have any lab test that is abnormal or we need to change your treatment, we will call you to review the results.   Testing/Procedures: Your physician has requested that you have an echocardiogram. Echocardiography is a painless test that uses sound waves to create images of your heart. It provides your doctor with information about the size and shape of your heart and how well your hearts chambers and valves are working. This procedure takes approximately one hour. There are no restrictions for this procedure.     Follow-Up: At [facility], you and your health needs are our priority.  As part of our continuing mission to provide you with exceptional heart care, we have created designated Provider Care Teams.  These Care Teams include your primary Cardiologist (physician) and Advanced Practice Providers (APPs -  Physician Assistants and Nurse Practitioners) who all work together to provide you with the care you need, when you need it.  We recommend signing up for the patient portal called "MyChart".  Sign up information is provided on this After Visit Summary.  MyChart is used to connect with patients for Virtual Visits (Telemedicine).  Patients are able to view lab/test results, encounter notes, upcoming appointments, etc.  Non-urgent messages can be sent to your provider as well.   To learn more about what you can do with MyChart, go to [email or web address].    Your next  appointment:   1 year(s)  The format for your next appointment:   In Person  Provider:   Cristopher Peru, MD   Other Instructions

## 2021-02-13 ENCOUNTER — Other Ambulatory Visit: Payer: Self-pay | Admitting: Internal Medicine

## 2021-02-13 NOTE — Telephone Encounter (Signed)
Confirmed with patient that he understands per Cardiology to hold Eliquis 2 days prior to his procedure on 2/15 and 2/16. Patient verbalized understanding.

## 2021-02-13 NOTE — Telephone Encounter (Signed)
ASSESSMENT AND PLAN:   Persistent Afib CHA2DS2Vasc is 2, on Eliquis, appropriately dosed 0% burden by symptoms Stable QTc on Tikosyn Update his labs   NICM No symptoms or exam findings of volume OL He says he is not taking aldactone, has not for a very long time (if ever by his memory) Dr. Lovena Le suspected he has had recover of his LVEF, reasonable to update his echo, this does NOT have to be done for his colonoscopy    HTN Looks good   4. Pre-colonoscopy cardiac eval Low risk procedure Low cardiac risk score No cardiac contraindication to the procedure OK to hold eliquis 2 days ahead, resume post procedure as per GI MD

## 2021-02-13 NOTE — Telephone Encounter (Signed)
Received confirmation from Cardiology that patient can hold Eliquis 2 days prior to his colonoscopy. Left message for patient to return my call.

## 2021-02-13 NOTE — Telephone Encounter (Signed)
Patient returned the call advised him we received clearance and he is to hold Eliquis for two days.

## 2021-02-14 NOTE — Telephone Encounter (Signed)
Prescription refill request for Eliquis received.  Indication: afib  Last office visit: 02/12/2021, Charlcie Cradle Scr: 1.59, 02/12/2021 Age: 56 yo  Weight: 115.1 kg   Refill sent.

## 2021-02-28 ENCOUNTER — Other Ambulatory Visit: Payer: Self-pay

## 2021-02-28 ENCOUNTER — Ambulatory Visit (HOSPITAL_COMMUNITY): Payer: 59 | Attending: Cardiology

## 2021-02-28 DIAGNOSIS — Z01810 Encounter for preprocedural cardiovascular examination: Secondary | ICD-10-CM | POA: Diagnosis not present

## 2021-02-28 DIAGNOSIS — I429 Cardiomyopathy, unspecified: Secondary | ICD-10-CM | POA: Diagnosis not present

## 2021-02-28 LAB — ECHOCARDIOGRAM COMPLETE
Area-P 1/2: 3.1 cm2
S' Lateral: 4 cm

## 2021-03-04 ENCOUNTER — Telehealth: Payer: Self-pay | Admitting: Internal Medicine

## 2021-03-04 ENCOUNTER — Encounter: Payer: Self-pay | Admitting: Internal Medicine

## 2021-03-04 ENCOUNTER — Telehealth: Payer: Self-pay | Admitting: Physician Assistant

## 2021-03-04 NOTE — Telephone Encounter (Signed)
Patient has procedure this Friday.  His cardiologist, Dr. Crissie Sickles, has requested you fax a cardiac clearance form as soon as you can so he can get it back to you before the procedure.  Thank you.

## 2021-03-04 NOTE — Telephone Encounter (Signed)
It appears Dr Tanna Furry staff advised patient to have GI request cardiology approval for procedure as they were unable to see approval in the chart. GI has already received cardiology approval to hold Eliquis 2 days prior to procedure as relayed to patient on 02/13/21. Patient is advised of this and has multiple questions about his prep. Upon questioning, he has lost his instructions. I have made instructions available at the front desk for patient pick up and have also send patient a text for mychart sign up in which he can also access instructions.

## 2021-03-04 NOTE — Telephone Encounter (Signed)
ECHOCARDIOGRAM COMPLETE: Result Notes  Baldwin Jamaica, PA-C  02/28/2021  1:18 PM EST     As suspected, his heart muscle function is back to normal.   Echo looks good    The patient has been notified of the result and verbalized understanding.  All questions (if any) were answered. Nuala Alpha, LPN 06/06/8414 6:06 PM    Pt states he is having a colonoscopy done on this Friday and GI MD was to send a clearance form for [place] to advise on.   Do not see this in his chart.  Advised him to call his GI MD's office now and have them urgently fax our office a cardiac/med clearance form to [place] at [phone] ATTN: Dr. Lovena Le and Tommye Standard PA-C.  Advised him to call now so that we can start working on this and get this back to his GI MD, so there isn't any delay in getting his colonoscopy done on Friday.   Pt states he will call them now to send. Pt verbalized understanding and agrees with this plan.  Pt was more than gracious for all the assistance provided.

## 2021-03-04 NOTE — Telephone Encounter (Signed)
Patient is calling about his echo results.

## 2021-03-04 NOTE — Telephone Encounter (Signed)
See notes from Dixon Boos, [place]: March 04, 2021 Larina Bras, CMA to Baldwin Jamaica, PA-C      4:59 PM  Clearance was obtained already. See 01/21/21 telephone note. These things get lost in the 100s of other documents in the system. Thank you for the follow up though and making sure this patient was taken care of :)

## 2021-03-07 ENCOUNTER — Other Ambulatory Visit: Payer: Self-pay | Admitting: Internal Medicine

## 2021-03-07 ENCOUNTER — Other Ambulatory Visit: Payer: Self-pay

## 2021-03-07 ENCOUNTER — Encounter: Payer: Self-pay | Admitting: Internal Medicine

## 2021-03-07 ENCOUNTER — Ambulatory Visit (AMBULATORY_SURGERY_CENTER): Payer: 59 | Admitting: Internal Medicine

## 2021-03-07 VITALS — BP 115/49 | HR 53 | Temp 95.5°F | Resp 16 | Ht 70.0 in | Wt 256.0 lb

## 2021-03-07 DIAGNOSIS — D123 Benign neoplasm of transverse colon: Secondary | ICD-10-CM | POA: Diagnosis not present

## 2021-03-07 DIAGNOSIS — D124 Benign neoplasm of descending colon: Secondary | ICD-10-CM

## 2021-03-07 DIAGNOSIS — Z86010 Personal history of colonic polyps: Secondary | ICD-10-CM | POA: Diagnosis present

## 2021-03-07 DIAGNOSIS — Z860101 Personal history of adenomatous and serrated colon polyps: Secondary | ICD-10-CM

## 2021-03-07 MED ORDER — SODIUM CHLORIDE 0.9 % IV SOLN: 500.0000 mL | INTRAVENOUS | Status: DC

## 2021-03-07 NOTE — Progress Notes (Signed)
GASTROENTEROLOGY PROCEDURE H&P NOTE   Primary Care Physician: [name], MD    Reason for Procedure:  Personal history of colon polyps  Plan:    Surveillance colonoscopy  Patient is appropriate for endoscopic procedure(s) in the ambulatory ([facility]) setting.  The nature of the procedure, as well as the risks, benefits, and alternatives were carefully and thoroughly reviewed with the patient. Ample time for discussion and questions allowed. The patient understood, was satisfied, and agreed to proceed.     HPI: Robert White is a 56 y.o. male who presents for colonoscopy.  Medical history as below.  Tolerated the prep.  No recent chest pain or shortness of breath.  No abdominal pain today.  Since being seen in the office echo was repeated on 02/28/2021 with EF normal.  Past Medical History:  Diagnosis Date   Atrial fibrillation, persistent (HCC)    Atrial flutter (HCC)    Chronic renal insufficiency    Baseline Cr around 1.2, likely secondary to HTN   Dilated cardiomyopathy ([place])    Hx of-resolves on echo 2008-EF 60-70%   Diverticulosis    Hyperlipidemia    Hypertension    Left ventricular dysfunction    ejection fraction 25-30% by 2-D echo   Nephrolithiasis 01/19/2010   Calcium oxalate stones   Tubular adenoma of colon     Past Surgical History:  Procedure Laterality Date   CARDIOVERSION N/A 07/01/2015   Procedure: CARDIOVERSION;  Surgeon: Sanda Klein, MD;  Location: [facility];  Service: Cardiovascular;  Laterality: N/A;   TEE WITHOUT CARDIOVERSION N/A 07/01/2015   Procedure: TRANSESOPHAGEAL ECHOCARDIOGRAM (TEE);  Surgeon: Sanda Klein, MD;  Location: [facility] ENDOSCOPY;  Service: Cardiovascular;  Laterality: N/A;   WISDOM TOOTH EXTRACTION      Prior to Admission medications   Medication Sig Start Date End Date Taking? Authorizing Provider  atorvastatin (LIPITOR) 40 MG tablet Take 1 tablet (40 mg total) by mouth daily. 10/14/20   Mitzi Hansen, MD  carvedilol  (COREG) 25 MG tablet TAKE 1 AND 1/2 TABLETS TWICE A DAY 03/22/20   Evans Lance, MD  diclofenac Sodium (VOLTAREN) 1 % GEL Apply 2 g topically 4 (four) times daily. 12/08/19   [name], MD  diltiazem (CARDIZEM CD) 240 MG 24 hr capsule Take 1 capsule (240 mg total) by mouth daily. 03/04/20   Evans Lance, MD  dofetilide (TIKOSYN) 500 MCG capsule TAKE 1 CAPSULE BY MOUTH 2 TIMES DAILY. 07/23/20   Evans Lance, MD  ELIQUIS 5 MG TABS tablet TAKE 1 TABLET BY MOUTH TWICE A DAY 02/14/21   Evans Lance, MD  magnesium oxide (MAG-OX) 400 MG tablet TAKE 1 TABLET BY MOUTH EVERY DAY 03/19/20   Evans Lance, MD  olmesartan (BENICAR) 40 MG tablet Take 1 tablet (40 mg total) by mouth daily. 10/14/20 10/14/21  Mitzi Hansen, MD  spironolactone (ALDACTONE) 25 MG tablet Take 1 tablet (25 mg total) by mouth daily. 10/14/20   Mitzi Hansen, MD    Current Outpatient Medications  Medication Sig Dispense Refill   atorvastatin (LIPITOR) 40 MG tablet Take 1 tablet (40 mg total) by mouth daily. 90 tablet 3   carvedilol (COREG) 25 MG tablet TAKE 1 AND 1/2 TABLETS TWICE A DAY 270 tablet 3   diclofenac Sodium (VOLTAREN) 1 % GEL Apply 2 g topically 4 (four) times daily. 100 g 1   diltiazem (CARDIZEM CD) 240 MG 24 hr capsule Take 1 capsule (240 mg total) by mouth daily. 90 capsule 3  dofetilide (TIKOSYN) 500 MCG capsule TAKE 1 CAPSULE BY MOUTH 2 TIMES DAILY. 180 capsule 2   ELIQUIS 5 MG TABS tablet TAKE 1 TABLET BY MOUTH TWICE A DAY 180 tablet 1   magnesium oxide (MAG-OX) 400 MG tablet TAKE 1 TABLET BY MOUTH EVERY DAY 90 tablet 3   olmesartan (BENICAR) 40 MG tablet Take 1 tablet (40 mg total) by mouth daily. 90 tablet 3   spironolactone (ALDACTONE) 25 MG tablet Take 1 tablet (25 mg total) by mouth daily. 90 tablet 1   Current Facility-Administered Medications  Medication Dose Route Frequency Provider Last Rate Last Admin   0.9 %  sodium chloride infusion  500 mL Intravenous Continuous Eliab Closson, Lajuan Lines, MD         Allergies as of 03/07/2021 - Review Complete 03/07/2021  Allergen Reaction Noted   Cephalexin Swelling 08/27/2014   Penicillins Swelling 05/23/2015    Family History  Problem Relation Age of Onset   Prostate cancer Father    Colon cancer Neg Hx     Social History   Socioeconomic History   Marital status: Married    Spouse name: Not on file   Number of children: Not on file   Years of education: Not on file   Highest education level: Not on file  Occupational History   Not on file  Tobacco Use   Smoking status: Never   Smokeless tobacco: Never  Substance and Sexual Activity   Alcohol use: No    Alcohol/week: 0.0 standard drinks   Drug use: No   Sexual activity: Not on file  Other Topics Concern   Not on file  Social History Narrative   Married, 2 daughters ages 42,20 (2012). Exercises regularly.   Social Determinants of Health   Financial Resource Strain: Not on file  Food Insecurity: Not on file  Transportation Needs: Not on file  Physical Activity: Not on file  Stress: Not on file  Social Connections: Not on file  Intimate Partner Violence: Not on file    Physical Exam: Vital signs in last 24 hours: @BP  104/61    Pulse (!) 54    Temp (!) 95.5 F (35.3 C)    Ht 5\' 10"  (1.778 m)    Wt 256 lb (116.1 kg)    SpO2 99%    BMI 36.73 kg/m  GEN: NAD EYE: Sclerae anicteric ENT: MMM CV: Non-tachycardic Pulm: CTA b/l GI: Soft, NT/ND NEURO:  Alert & Oriented x 3   [name], MD [facility]  03/07/2021 9:57 AM

## 2021-03-07 NOTE — Progress Notes (Signed)
To Pacu, VSS. Report to [email or web address].tb 

## 2021-03-07 NOTE — Progress Notes (Signed)
Called to room to assist during endoscopic procedure.  Patient ID and intended procedure confirmed with present staff. Received instructions for my participation in the procedure from the performing physician.  

## 2021-03-07 NOTE — Patient Instructions (Signed)
Handout on polyps and diverticulosis given.  Resume Eliquis at prior dose tomorrow.  Refer to managing physician for further adjustment therapy.    YOU HAD AN ENDOSCOPIC PROCEDURE TODAY AT [facility]:   Refer to the procedure report that was given to you for any specific questions about what was found during the examination.  If the procedure report does not answer your questions, please call your gastroenterologist to clarify.  If you requested that your care partner not be given the details of your procedure findings, then the procedure report has been included in a sealed envelope for you to review at your convenience later.  YOU SHOULD EXPECT: Some feelings of bloating in the abdomen. Passage of more gas than usual.  Walking can help get rid of the air that was put into your GI tract during the procedure and reduce the bloating. If you had a lower endoscopy (such as a colonoscopy or flexible sigmoidoscopy) you may notice spotting of blood in your stool or on the toilet paper. If you underwent a bowel prep for your procedure, you may not have a normal bowel movement for a few days.  Please Note:  You might notice some irritation and congestion in your nose or some drainage.  This is from the oxygen used during your procedure.  There is no need for concern and it should clear up in a day or so.  SYMPTOMS TO REPORT IMMEDIATELY:  Following lower endoscopy (colonoscopy or flexible sigmoidoscopy):  Excessive amounts of blood in the stool  Significant tenderness or worsening of abdominal pains  Swelling of the abdomen that is new, acute  Fever of 100F or higher  For urgent or emergent issues, a gastroenterologist can be reached at any hour by calling [phone]. Do not use MyChart messaging for urgent concerns.    DIET:  We do recommend a small meal at first, but then you may proceed to your regular diet.  Drink plenty of fluids but you should avoid alcoholic beverages for  24 hours.  ACTIVITY:  You should plan to take it easy for the rest of today and you should NOT DRIVE or use heavy machinery until tomorrow (because of the sedation medicines used during the test).    FOLLOW UP: Our staff will call the number listed on your records 48-72 hours following your procedure to check on you and address any questions or concerns that you may have regarding the information given to you following your procedure. If we do not reach you, we will leave a message.  We will attempt to reach you two times.  During this call, we will ask if you have developed any symptoms of COVID 19. If you develop any symptoms (ie: fever, flu-like symptoms, shortness of breath, cough etc.) before then, please call [phone].  If you test positive for Covid 19 in the 2 weeks post procedure, please call and report this information to [place].    If any biopsies were taken you will be contacted by phone or by letter within the next 1-3 weeks.  Please call us at [phone] if you have not heard about the biopsies in 3 weeks.    SIGNATURES/CONFIDENTIALITY: You and/or your care partner have signed paperwork which will be entered into your electronic medical record.  These signatures attest to the fact that that the information above on your After Visit Summary has been reviewed and is understood.  Full responsibility of the confidentiality of this discharge information lies  with you and/or your care-partner.

## 2021-03-07 NOTE — Op Note (Signed)
[facility] Patient Name: Robert White Procedure Date: 03/07/2021 9:57 AM MRN: [id number] Endoscopist: Jerene Bears , MD Age: 56 Referring MD:  Date of Birth: [birth year] Gender: Male Account #: [id number] Procedure:                Colonoscopy Indications:              High risk colon cancer surveillance: Personal                            history of non-advanced adenoma, Last colonoscopy:                            August 2016 Medicines:                Monitored Anesthesia Care Procedure:                Pre-Anesthesia Assessment:                           - Prior to the procedure, a History and Physical                            was performed, and patient medications and                            allergies were reviewed. The patient's tolerance of                            previous anesthesia was also reviewed. The risks                            and benefits of the procedure and the sedation                            options and risks were discussed with the patient.                            All questions were answered, and informed consent                            was obtained. Prior Anticoagulants: The patient has                            taken Eliquis (apixaban), last dose was 2 days                            prior to procedure. ASA Grade Assessment: II - A                            patient with mild systemic disease. After reviewing                            the risks and benefits, the patient was deemed in  satisfactory condition to undergo the procedure.                           After obtaining informed consent, the colonoscope                            was passed under direct vision. Throughout the                            procedure, the patient's blood pressure, pulse, and                            oxygen saturations were monitored continuously. The                            Olympus CF-HQ190L ([id number]) Colonoscope was                             introduced through the anus and advanced to the                            cecum, identified by appendiceal orifice and                            ileocecal valve. The colonoscopy was performed                            without difficulty. The patient tolerated the                            procedure well. The quality of the bowel                            preparation was good. The ileocecal valve,                            appendiceal orifice, and rectum were photographed. Scope In: 10:19:01 AM Scope Out: 10:38:46 AM Scope Withdrawal Time: 0 hours 17 minutes 14 seconds  Total Procedure Duration: 0 hours 19 minutes 45 seconds  Findings:                 The digital rectal exam was normal.                           Two sessile polyps were found in the transverse                            colon. The polyps were 4 to 6 mm in size. These                            polyps were removed with a cold snare. Resection                            and retrieval were complete.  Three sessile polyps were found in the descending                            colon. The polyps were 3 to 6 mm in size. These                            polyps were removed with a cold snare. Resection                            and retrieval were complete.                           Multiple small and large-mouthed diverticula were                            found in the sigmoid colon, descending colon,                            transverse colon and ascending colon.                           Internal hemorrhoids were found during                            retroflexion. The hemorrhoids were small. Complications:            No immediate complications. Estimated Blood Loss:     Estimated blood loss was minimal. Impression:               - Two 4 to 6 mm polyps in the transverse colon,                            removed with a cold snare. Resected and retrieved.                            - Three 3 to 6 mm polyps in the descending colon,                            removed with a cold snare. Resected and retrieved.                           - Diverticulosis in the sigmoid colon, in the                            descending colon, in the transverse colon and in                            the ascending colon.                           - Internal hemorrhoids. Recommendation:           - Patient has a contact number available for  emergencies. The signs and symptoms of potential                            delayed complications were discussed with the                            patient. Return to normal activities tomorrow.                            Written discharge instructions were provided to the                            patient.                           - Resume previous diet.                           - Continue present medications.                           - Resume Eliquis (apixaban) at prior dose tomorrow.                            Refer to managing physician for further adjustment                            of therapy.                           - Await pathology results.                           - Repeat colonoscopy is recommended for                            surveillance. The colonoscopy date will be                            determined after pathology results from today's                            exam become available for review. Jerene Bears, MD 03/07/2021 10:42:05 AM This report has been signed electronically.

## 2021-03-11 ENCOUNTER — Telehealth: Payer: Self-pay | Admitting: *Deleted

## 2021-03-11 NOTE — Telephone Encounter (Signed)
-----   Message from [place], [place] sent at 02/28/2021  1:18 PM EST ----- As suspected, his heart muscle function is back to normal.   Echo looks good

## 2021-03-11 NOTE — Telephone Encounter (Signed)
Lvm for patient of normal results and clinic number if any questions needed to be answered.

## 2021-03-12 ENCOUNTER — Encounter: Payer: Self-pay | Admitting: Internal Medicine

## 2021-03-12 ENCOUNTER — Telehealth: Payer: Self-pay

## 2021-03-12 NOTE — Telephone Encounter (Signed)
°  Follow up Call-  Call back number 03/07/2021  Post procedure Call Back phone  # [phone]  Permission to leave phone message Yes  Some recent data might be hidden     Patient questions:  Do you have a fever, pain , or abdominal swelling? No. Pain Score  0 *  Have you tolerated food without any problems? Yes.    Have you been able to return to your normal activities? Yes.    Do you have any questions about your discharge instructions: Diet   No. Medications  No. Follow up visit  No.  Do you have questions or concerns about your Care? No.  Actions: * If pain score is 4 or above: No action needed, pain <4.  Have you developed a fever since your procedure? no  2.   Have you had an respiratory symptoms (SOB or cough) since your procedure? no  3.   Have you tested positive for COVID 19 since your procedure no  4.   Have you had any family members/close contacts diagnosed with the COVID 19 since your procedure?  no   If yes to any of these questions please route to Joylene John, RN and Joella Prince, RN

## 2021-03-13 ENCOUNTER — Other Ambulatory Visit: Payer: Self-pay | Admitting: Internal Medicine

## 2021-04-05 ENCOUNTER — Other Ambulatory Visit: Payer: Self-pay | Admitting: Internal Medicine

## 2021-04-05 DIAGNOSIS — I4819 Other persistent atrial fibrillation: Secondary | ICD-10-CM

## 2021-05-12 ENCOUNTER — Encounter: Payer: 59 | Admitting: Internal Medicine

## 2021-09-09 ENCOUNTER — Other Ambulatory Visit: Payer: Self-pay | Admitting: Internal Medicine

## 2021-09-09 DIAGNOSIS — I4819 Other persistent atrial fibrillation: Secondary | ICD-10-CM

## 2021-09-09 NOTE — Telephone Encounter (Signed)
Eliquis '5mg'$  refill request received. Patient is 56 years old, weight-116.1kg, Crea-1.59 on 02/12/2021, Diagnosis-Afib, and last seen by Dr. Lovena Le on 02/12/2021. Dose is appropriate based on dosing criteria. Will send in refill to requested pharmacy.

## 2021-11-01 ENCOUNTER — Other Ambulatory Visit: Payer: Self-pay | Admitting: Internal Medicine

## 2021-11-01 DIAGNOSIS — E785 Hyperlipidemia, unspecified: Secondary | ICD-10-CM

## 2021-11-05 ENCOUNTER — Telehealth: Payer: Self-pay

## 2021-11-05 DIAGNOSIS — I4819 Other persistent atrial fibrillation: Secondary | ICD-10-CM

## 2021-11-05 MED ORDER — DOFETILIDE 500 MCG PO CAPS: 500.0000 ug | ORAL_CAPSULE | Freq: Two times a day (BID) | ORAL | 3 refills | Status: DC

## 2021-11-05 NOTE — Telephone Encounter (Signed)
Medication Tikosyn / Dofetilide script printed, needs DR Lovena Le signature.  Will fax signed script to [facility].

## 2021-11-05 NOTE — Addendum Note (Signed)
Addended by: Oleta Mouse C on: 11/05/2021 12:46 PM   Modules accepted: Orders

## 2021-11-05 NOTE — Telephone Encounter (Signed)
**Note De-Identified Kajuan [place] Obfuscation** We received a Tikosyn/Dofetilide PA form from [facility] fax.  I have completed the form for generic Dofetilide and have e-mailed it to Dr Tanna Furry nurse so he can print a Dofetilide 500 mcg RX for #180 with 3 refills, have Dr Lovena Le sign/date it and the PA form, and to then fax all to [facility] at the fax number written on the cover letter included.

## 2021-11-05 NOTE — Addendum Note (Signed)
Addended by: Oleta Mouse C on: 11/05/2021 12:40 PM   Modules accepted: Orders

## 2021-11-06 MED ORDER — DOFETILIDE 500 MCG PO CAPS: 500.0000 ug | ORAL_CAPSULE | Freq: Two times a day (BID) | ORAL | 3 refills | Status: DC

## 2021-11-06 NOTE — Addendum Note (Signed)
Addended by: Oleta Mouse C on: 11/06/2021 07:53 AM   Modules accepted: Orders

## 2021-11-08 ENCOUNTER — Other Ambulatory Visit: Payer: Self-pay | Admitting: Internal Medicine

## 2021-11-08 DIAGNOSIS — I10 Essential (primary) hypertension: Secondary | ICD-10-CM

## 2021-11-10 ENCOUNTER — Other Ambulatory Visit: Payer: Self-pay | Admitting: Internal Medicine

## 2021-11-10 NOTE — Telephone Encounter (Signed)
**Note De-Identified [name] Obfuscation** Letter received Robert White fax from [place] stating that they have approved the pts Dofetilide for coverage until 11/07/2022.  I have notified [id number] [place], [place] [address](Ph: [phone] of this approval.

## 2021-11-27 ENCOUNTER — Other Ambulatory Visit (HOSPITAL_COMMUNITY): Payer: Self-pay

## 2021-11-27 MED ORDER — WEGOVY 0.5 MG/0.5ML ~~LOC~~ SOAJ
0.5000 mg | SUBCUTANEOUS | 0 refills | Status: DC
  Filled 2021-11-27: qty 2, 28d supply, fill #0

## 2021-11-27 MED ORDER — WEGOVY 1 MG/0.5ML ~~LOC~~ SOAJ
1.0000 mg | SUBCUTANEOUS | 0 refills | Status: DC
  Filled 2021-11-27 – 2022-02-19 (×2): qty 2, 28d supply, fill #0

## 2021-11-27 MED ORDER — WEGOVY 0.25 MG/0.5ML ~~LOC~~ SOAJ
0.2500 mg | SUBCUTANEOUS | 0 refills | Status: DC
  Filled 2021-11-27 – 2022-04-21 (×2): qty 2, 28d supply, fill #0

## 2021-12-09 ENCOUNTER — Encounter: Payer: Self-pay | Admitting: Student

## 2021-12-09 ENCOUNTER — Ambulatory Visit (INDEPENDENT_AMBULATORY_CARE_PROVIDER_SITE_OTHER): Payer: 59 | Admitting: Student

## 2021-12-09 VITALS — BP 123/77 | HR 65 | Temp 98.4°F | Wt 269.4 lb

## 2021-12-09 DIAGNOSIS — I5022 Chronic systolic (congestive) heart failure: Secondary | ICD-10-CM

## 2021-12-09 DIAGNOSIS — I480 Paroxysmal atrial fibrillation: Secondary | ICD-10-CM

## 2021-12-09 DIAGNOSIS — I10 Essential (primary) hypertension: Secondary | ICD-10-CM

## 2021-12-09 DIAGNOSIS — E782 Mixed hyperlipidemia: Secondary | ICD-10-CM | POA: Diagnosis not present

## 2021-12-09 DIAGNOSIS — J069 Acute upper respiratory infection, unspecified: Secondary | ICD-10-CM

## 2021-12-09 DIAGNOSIS — I130 Hypertensive heart and chronic kidney disease with heart failure and stage 1 through stage 4 chronic kidney disease, or unspecified chronic kidney disease: Secondary | ICD-10-CM | POA: Diagnosis not present

## 2021-12-09 DIAGNOSIS — E669 Obesity, unspecified: Secondary | ICD-10-CM

## 2021-12-09 DIAGNOSIS — Z0000 Encounter for general adult medical examination without abnormal findings: Secondary | ICD-10-CM

## 2021-12-09 DIAGNOSIS — N1831 Chronic kidney disease, stage 3a: Secondary | ICD-10-CM

## 2021-12-09 DIAGNOSIS — Z23 Encounter for immunization: Secondary | ICD-10-CM

## 2021-12-09 NOTE — Progress Notes (Unsigned)
   CC: Follow-up  HPI:  [name] is a 56 y.o. male with history as below who presents for routine follow-up.  Please see encounters tab for problem-based charting.  Past Medical History:  Diagnosis Date   Atrial fibrillation, persistent (HCC)    Atrial flutter (HCC)    Chronic renal insufficiency    Baseline Cr around 1.2, likely secondary to HTN   Dilated cardiomyopathy ([place])    Hx of-resolves on echo 2008-EF 60-70%   Diverticulosis    Hyperlipidemia    Hypertension    Left ventricular dysfunction    ejection fraction 25-30% by 2-D echo   Nephrolithiasis 01/19/2010   Calcium oxalate stones   Tubular adenoma of colon    Review of Systems:   A comprehensive review of systems was negative except for: cough   Physical Exam:  Vitals:   12/09/21 0857  BP: 123/77  Pulse: 65  Temp: 98.4 F (36.9 C)  TempSrc: Oral  SpO2: 100%  Weight: 269 lb 6.4 oz (122.2 kg)   Constitutional: Obese middle-age male sitting in his chair. In no acute distress. Eyes: Sclera non-icteric, EOM intact Cardio:Regular rate and rhythm. No murmurs, rubs, or gallops. 2+ bilateral radial pulses. Pulm:Clear to auscultation bilaterally. Normal work of breathing on room air. Abdomen: Soft, non-tender, non-distended, positive bowel sounds. YSH:UOHFGBMS for extremity edema. Skin:Warm and dry. Neuro:Alert and oriented x3. No focal deficit noted. Psych:Pleasant mood and affect.   Assessment & Plan:   See Encounters Tab for problem based charting.  Patient seen with Dr. Dareen Piano

## 2021-12-09 NOTE — Patient Instructions (Signed)
  Thank you, Robert White, for allowing [place] to provide your care today. Today we discussed . . .  > High cholesterol       -With your LDL going up to 127 on atorvastatin 40 mg daily we are going to increase you to atorvastatin 80 mg daily.  We will plan to see you back in 3 months and reevaluate.  If you have any issues between then please do not hesitate to call our office. > Heart failure/atrial fibrillation       -Please continue to follow-up with your cardiologist as scheduled and you can discuss your dofetilide and diltiazem with them.  We will not make any changes today as discussed. > Weight loss       -We do think starting Wegovy would be good for your overall health and hopefully you will be able to pick it up from the pharmacy soon.   I have ordered the following labs for you:  Lab Orders  No laboratory test(s) ordered today      Tests ordered today:  None   Referrals ordered today:   Referral Orders  No referral(s) requested today      I have ordered the following medication/changed the following medications:   Stop the following medications: There are no discontinued medications.   Start the following medications: No orders of the defined types were placed in this encounter.     Follow up: 3-4 months    Remember:     Should you have any questions or concerns please call the internal medicine clinic at [phone].     Johny Blamer, [place]

## 2021-12-10 DIAGNOSIS — E669 Obesity, unspecified: Secondary | ICD-10-CM | POA: Insufficient documentation

## 2021-12-10 DIAGNOSIS — J069 Acute upper respiratory infection, unspecified: Secondary | ICD-10-CM | POA: Insufficient documentation

## 2021-12-10 NOTE — Assessment & Plan Note (Addendum)
Atorvastatin was increased to 40 mg daily in 09/2020.  Lipid panel at that time showed total cholesterol 137, HDL 37, LDL 83.  Recent lipid panel on 11/12/2021 showed his LDL increased to 127.  He had been on a carnivore diet for a few months and this could have been the cause.  He has recently stopped that diet and started eating more vegetables.  We will continue to monitor as he is hesitant to increase or start any medication at this time. - Continue atorvastatin 40 mg daily - Repeat lipid panel in 6 months

## 2021-12-10 NOTE — Assessment & Plan Note (Signed)
Blood pressure today 123/77.  No reported adverse effects on current therapy.  Discussed with patient to talk to cardiology about his diltiazem due to his history of HFrecEF.  Will defer to their decision on continuance or alteration. - Continue olmesartan 40 mg daily, spironolactone 25 mg daily, carvedilol 37.5 mg twice daily, and diltiazem 240 mg daily

## 2021-12-10 NOTE — Assessment & Plan Note (Addendum)
Last BMP in our system on 02/12/2021 with stable renal function and GFR of 50.  He does bring with him recent lab results from 11/12/2021 which includes a CMP with continued stable renal function. - Continue to monitor renal function with checks every 3 to 6 months

## 2021-12-10 NOTE — Assessment & Plan Note (Signed)
Patient with history of HFrEF with LV EF of 25 to 30% in 2018.  Echo in 02/2021 showed LVEF of 55 to 60% with mild LVH.  Continues to follow with cardiology with last appointment on 02/12/2021, followed by Dr. Lovena Le.  Continues on GDMT and will continue to follow-up with Dr. Tanna Furry office. - Continue olmesartan 40 mg daily, spironolactone 25 mg daily, carvedilol 37.5 mg twice daily - We will consider adding SGLT2 but will defer to cardiology as he is planning to asked them about continuing dofetilide and diltiazem

## 2021-12-10 NOTE — Progress Notes (Signed)
Internal Medicine Clinic Attending  I saw and evaluated the patient.  I personally confirmed the key portions of the history and exam documented by Dr. Goodwin and I reviewed pertinent patient test results.  The assessment, diagnosis, and plan were formulated together and I agree with the documentation in the resident's note.  

## 2021-12-10 NOTE — Assessment & Plan Note (Signed)
Patient currently in normal sinus rhythm during clinic visit.  Denies any issues on current therapy.  Discussed with patient to talk to his cardiologist about his concerns of the side effects of dofetilide.  Will defer any changes to them. - Continue dofetilide 500 mg twice daily and Eliquis 5 mg twice daily

## 2021-12-10 NOTE — Assessment & Plan Note (Signed)
Patient recently saw [facility] to initiate [facility] for weight loss.  He states that his insurance company is covering the medication but unfortunately has not been able to pick it up due to shortages.  On initial visit they did check labs on 11/12/2021 with CBC, CMP, A1c, vitamin D, TSH.  Lab work showed stable renal function with CKD stage IIIa, LDL of 127, and otherwise normal results. - Continue with planned [facility] and follow-up with [facility]

## 2021-12-10 NOTE — Assessment & Plan Note (Signed)
Patient with acute complaints today of 5 days of cough that is responsive to Coricidin.  Notes that he was at a football game [place] this past weekend and sweated while getting to his seat which then made it very cold.  He developed this cough after this but denies any other associated symptoms including sore throat, fever, dyspnea, chest pain, anosmia.  Symptoms are improving overall.  Continue with symptomatic treatment with Coricidin.

## 2022-02-09 ENCOUNTER — Telehealth: Payer: Self-pay

## 2022-02-09 NOTE — Telephone Encounter (Signed)
Pt is requesting that his med be changed  to the Ozempic because even after the Approval of his PA for the wegovy   it is still $733 a month

## 2022-02-16 ENCOUNTER — Telehealth: Payer: Self-pay | Admitting: Internal Medicine

## 2022-02-16 NOTE — Telephone Encounter (Signed)
Pt called per message received in [place] Triage.    Pt states that his Eliquis has become too expensive.  ( See note below...)   Pt told that I can mail him a form with information regarding the Patient Assistance Program, He can apply, and get Eliquis at a reduced price.  Pt was put on Eliquis on 07/23/2020 per records in [place].   Per [facility] management, ok to give Pt 2 weeks worth of Eliquis 5 mg, to cover him while he applies for financial assistance.    Pt given Patient Assistance form, and 2 boxes of Eliquis 5 mg; Medication / form taken to front office for patient to pick up.  Pt called, and told to complete the form, and to bring it back to [facility].  Pt appreciated the free samples to cover him during the transition.

## 2022-02-16 NOTE — Telephone Encounter (Signed)
Pt c/o medication issue:  1. Name of Medication: ELIQUIS 5 MG TABS tablet   2. How are you currently taking this medication (dosage and times per day)?  TAKE 1 TABLET BY MOUTH TWICE A DAY  3. Are you having a reaction (difficulty breathing--STAT)? No  4. What is your medication issue? Pt would like a callback regarding cost of medication. He states that he usually pays $10 due to discount card but was told to pay $200 when getting medication. Please advise

## 2022-02-19 ENCOUNTER — Other Ambulatory Visit (HOSPITAL_COMMUNITY): Payer: Self-pay

## 2022-03-09 ENCOUNTER — Other Ambulatory Visit: Payer: Self-pay | Admitting: Internal Medicine

## 2022-03-09 DIAGNOSIS — I4819 Other persistent atrial fibrillation: Secondary | ICD-10-CM

## 2022-03-14 ENCOUNTER — Other Ambulatory Visit: Payer: Self-pay | Admitting: Internal Medicine

## 2022-03-14 DIAGNOSIS — I4819 Other persistent atrial fibrillation: Secondary | ICD-10-CM

## 2022-03-16 NOTE — Telephone Encounter (Signed)
Prescription refill request for Eliquis received. Indication: Afib  Last office visit: 02/12/21 Charlcie Cradle)  Scr: 1.55 (11/12/21)  Age: 57 Weight: 122.2kg   Appropriate dose. Refill sent.

## 2022-03-19 ENCOUNTER — Encounter: Payer: Self-pay | Admitting: Physician Assistant

## 2022-03-19 ENCOUNTER — Ambulatory Visit (INDEPENDENT_AMBULATORY_CARE_PROVIDER_SITE_OTHER): Payer: 59

## 2022-03-19 ENCOUNTER — Ambulatory Visit: Payer: 59 | Attending: Physician Assistant | Admitting: Physician Assistant

## 2022-03-19 VITALS — BP 116/70 | HR 72 | Ht 70.0 in | Wt 275.4 lb

## 2022-03-19 DIAGNOSIS — Z79899 Other long term (current) drug therapy: Secondary | ICD-10-CM

## 2022-03-19 DIAGNOSIS — R9431 Abnormal electrocardiogram [ECG] [EKG]: Secondary | ICD-10-CM | POA: Diagnosis not present

## 2022-03-19 DIAGNOSIS — I428 Other cardiomyopathies: Secondary | ICD-10-CM

## 2022-03-19 DIAGNOSIS — I493 Ventricular premature depolarization: Secondary | ICD-10-CM

## 2022-03-19 DIAGNOSIS — I4819 Other persistent atrial fibrillation: Secondary | ICD-10-CM

## 2022-03-19 DIAGNOSIS — Z5181 Encounter for therapeutic drug level monitoring: Secondary | ICD-10-CM

## 2022-03-19 DIAGNOSIS — D6869 Other thrombophilia: Secondary | ICD-10-CM

## 2022-03-19 DIAGNOSIS — I10 Essential (primary) hypertension: Secondary | ICD-10-CM

## 2022-03-19 LAB — CBC
Hematocrit: 45.9 % (ref 37.5–51.0)
Hemoglobin: 15.4 g/dL (ref 13.0–17.7)
MCH: 30.3 pg (ref 26.6–33.0)
MCHC: 33.6 g/dL (ref 31.5–35.7)
MCV: 90 fL (ref 79–97)
Platelets: 247 10*3/uL (ref 150–450)
RBC: 5.08 x10E6/uL (ref 4.14–5.80)
RDW: 14.1 % (ref 11.6–15.4)
WBC: 11.1 10*3/uL — ABNORMAL HIGH (ref 3.4–10.8)

## 2022-03-19 LAB — BASIC METABOLIC PANEL
BUN/Creatinine Ratio: 15 (ref 9–20)
BUN: 24 mg/dL (ref 6–24)
CO2: 25 mmol/L (ref 20–29)
Calcium: 9.5 mg/dL (ref 8.7–10.2)
Chloride: 102 mmol/L (ref 96–106)
Creatinine, Ser: 1.57 mg/dL — ABNORMAL HIGH (ref 0.76–1.27)
Glucose: 106 mg/dL — ABNORMAL HIGH (ref 70–99)
Potassium: 4.4 mmol/L (ref 3.5–5.2)
Sodium: 138 mmol/L (ref 134–144)
eGFR: 51 mL/min/{1.73_m2} — ABNORMAL LOW (ref >59)

## 2022-03-19 LAB — MAGNESIUM: Magnesium: 1.9 mg/dL (ref 1.6–2.3)

## 2022-03-19 MED ORDER — DOFETILIDE 500 MCG PO CAPS: 500.0000 ug | ORAL_CAPSULE | Freq: Two times a day (BID) | ORAL | 3 refills | Status: DC

## 2022-03-19 NOTE — Patient Instructions (Addendum)
Medication Instructions:   Your physician recommends that you continue on your current medications as directed. Please refer to the Current Medication list given to you today.   *If you need a refill on your cardiac medications before your next appointment, please call your pharmacy*   Lab Work: STAT  BMET  MAG  AND CBC TROPONIN  TODAY    If you have labs (blood work) drawn today and your tests are completely normal, you will receive your results only by: MyChart Message (if you have MyChart) OR A paper copy in the mail If you have any lab test that is abnormal or we need to change your treatment, we will call you to review the results.   Testing/Procedures:  Your physician has requested that you have an echocardiogram. Echocardiography is a painless test that uses sound waves to create images of your heart. It provides your doctor with information about the size and shape of your heart and how well your heart's chambers and valves are working. This procedure takes approximately one hour. There are no restrictions for this procedure. Please do NOT wear cologne, perfume, aftershave, or lotions (deodorant is allowed). Please arrive 15 minutes prior to your appointment time.  Your physician has recommended that you wear an event monitor. Event monitors are medical devices that record the heart's electrical activity. Doctors most often [place] these monitors to diagnose arrhythmias. Arrhythmias are problems with the speed or rhythm of the heartbeat. The monitor is a small, portable device. You can wear one while you do your normal daily activities. This is usually used to diagnose what is causing palpitations/syncope (passing out).    Follow-Up: At [facility], you and your health needs are our priority.  As part of our continuing mission to provide you with exceptional heart care, we have created designated Provider Care Teams.  These Care Teams include your primary Cardiologist  (physician) and Advanced Practice Providers (APPs -  Physician Assistants and Nurse Practitioners) who all work together to provide you with the care you need, when you need it.  We recommend signing up for the patient portal called "MyChart".  Sign up information is provided on this After Visit Summary.  MyChart is used to connect with patients for Virtual Visits (Telemedicine).  Patients are able to view lab/test results, encounter notes, upcoming appointments, etc.  Non-urgent messages can be sent to your provider as well.   To learn more about what you can do with MyChart, go to [email or web address].    Your next appointment:   4 month(s)  Provider:   Tommye Standard, PA-C    Other Instructions Bryn Gulling- Long Term Monitor Instructions  Your physician has requested you wear a ZIO patch monitor for 3 days.  This is a single patch monitor. [employer] supplies one patch monitor per enrollment. Additional stickers are not available. Please do not apply patch if you will be having a Nuclear Stress Test,  Echocardiogram, Cardiac CT, MRI, or Chest Xray during the period you would be wearing the  monitor. The patch cannot be worn during these tests. You cannot remove and re-apply the  ZIO XT patch monitor.  Your ZIO patch monitor will be mailed 3 day USPS to your address on file. It may take 3-5 days  to receive your monitor after you have been enrolled.  Once you have received your monitor, please review the enclosed instructions. Your monitor  has already been registered assigning a specific monitor serial # to you.  Billing  and Patient Assistance Program Information  We have supplied [employer] with any of your insurance information on file for billing purposes. [employer] offers a sliding scale Patient Assistance Program for patients that do not have  insurance, or whose insurance does not completely cover the cost of the ZIO monitor.  You must apply for the Patient Assistance Program to qualify for  this discounted rate.  To apply, please call [employer] at [phone], select option 4, select option 2, ask to apply for  Patient Assistance Program. Theodore Demark will ask your household income, and how many people  are in your household. They will quote your out-of-pocket cost based on that information.  [employer] will also be able to set up a 80-month interest-free payment plan if needed.  Applying the monitor   Shave hair from upper left chest.  Hold abrader disc by orange tab. Rub abrader in 40 strokes over the upper left chest as  indicated in your monitor instructions.  Clean area with 4 enclosed alcohol pads. Let dry.  Apply patch as indicated in monitor instructions. Patch will be placed under collarbone on left  side of chest with arrow pointing upward.  Rub patch adhesive wings for 2 minutes. Remove white label marked "1". Remove the white  label marked "2". Rub patch adhesive wings for 2 additional minutes.  While looking in a mirror, press and release button in center of patch. A small green light will  flash 3-4 times. This will be your only indicator that the monitor has been turned on.  Do not shower for the first 24 hours. You may shower after the first 24 hours.  Press the button if you feel a symptom. You will hear a small click. Record Date, Time and  Symptom in the Patient Logbook.  When you are ready to remove the patch, follow instructions on the last 2 pages of Patient  Logbook. Stick patch monitor onto the last page of Patient Logbook.  Place Patient Logbook in the blue and white box. Use locking tab on box and tape box closed  securely. The blue and white box has prepaid postage on it. Please place it in the mailbox as  soon as possible. Your physician should have your test results approximately 7 days after the  monitor has been [facility]  Call ICarmichaelat [phone] you have questions regarding  your ZIO XT patch monitor.  Call them immediately if you see an orange light blinking on your  monitor.  If your monitor falls off in less than 4 days, contact our Monitor department at [phone]  If your monitor becomes loose or falls off after 4 days call [employer] at [phone]  suggestions on securing your monitor

## 2022-03-19 NOTE — Progress Notes (Addendum)
Cardiology Office Note Date:  03/19/2022  Patient ID:  Robert White, Robert White 1965-10-08, MRN [id number] PCP:  Johny Blamer, DO  Cardiologist:  Dr. Gwenlyn Found Electrophysiologist: Dr. Lovena Le     Chief Complaint: annual visit, pre-op  History of Present Illness: Robert White is a 57 y.o. male with history of HTN, NICM (tachy-mediated), HLD, obesity and Afib  He comes in today to be seen for Dr. Lovena Le, last seen by him Jan 2022, doing well, very active, working 2 jobs, suspected his LVEF had improved.  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 No CP, palpitations or cardiac awareness Reports excellent exertional capacity No dizzy spells, near syncope or syncope No SOB, DOE No bleeding or signs of bleeding Stable QTc Labs and updated echo planned No changes were made  LVEF recovered to 55-60%, no WMA, RV OK, no VHD  TODAY He feels well. No CP, palpitations or cardiac awareness of any kind He does not think he has had any AFib No SOB or exertional intolerances Reports good medication compliance No bleeding, signs of bleeding No dizzy spells, near syncope or syncopne  Afib hx Diagnosed 2017 Tikosyn started 2018,  is current    Past Medical History:  Diagnosis Date   Atrial fibrillation, persistent (HCC)    Atrial flutter (HCC)    Chronic renal insufficiency    Baseline Cr around 1.2, likely secondary to HTN   Dilated cardiomyopathy ([place])    Hx of-resolves on echo 2008-EF 60-70%   Diverticulosis    Hyperlipidemia    Hypertension    Left ventricular dysfunction    ejection fraction 25-30% by 2-D echo   Nephrolithiasis 01/19/2010   Calcium oxalate stones   Tubular adenoma of colon     Past Surgical History:  Procedure Laterality Date   CARDIOVERSION N/A 07/01/2015   Procedure: CARDIOVERSION;   Surgeon: Sanda Klein, MD;  Location: [place];  Service: Cardiovascular;  Laterality: N/A;   TEE WITHOUT CARDIOVERSION N/A 07/01/2015   Procedure: TRANSESOPHAGEAL ECHOCARDIOGRAM (TEE);  Surgeon: Sanda Klein, MD;  Location: [facility] ENDOSCOPY;  Service: Cardiovascular;  Laterality: N/A;   WISDOM TOOTH EXTRACTION      Current Outpatient Medications  Medication Sig Dispense Refill   atorvastatin (LIPITOR) 40 MG tablet TAKE 1 TABLET BY MOUTH EVERY DAY 90 tablet 3   carvedilol (COREG) 25 MG tablet TAKE 1 AND 1/2 TABLETS BY MOUTH TWICE A DAY 270 tablet 3   diclofenac Sodium (VOLTAREN) 1 % GEL Apply 2 g topically 4 (four) times daily. 100 g 1   diltiazem (CARDIZEM CD) 240 MG 24 hr capsule TAKE 1 CAPSULE BY MOUTH EVERY DAY 90 capsule 3   dofetilide (TIKOSYN) 500 MCG capsule Take 1 capsule (500 mcg total) by mouth 2 (two) times daily. 180 capsule 3   ELIQUIS 5 MG TABS tablet TAKE 1 TABLET BY MOUTH TWICE A DAY 60 tablet 5   magnesium oxide (MAG-OX) 400 (240 Mg) MG tablet TAKE 1 TABLET BY MOUTH EVERY DAY 90 tablet 3   olmesartan (BENICAR) 40 MG tablet TAKE 1 TABLET BY MOUTH EVERY DAY 90 tablet 3   Semaglutide-Weight Management (WEGOVY) 0.25 MG/0.5ML SOAJ Inject 0.25 mg into the skin once a week. 2 mL 0   Semaglutide-Weight Management (WEGOVY) 0.5 MG/0.5ML SOAJ Inject 0.5 mg into the skin once a  week. 2 mL 0   Semaglutide-Weight Management (WEGOVY) 1 MG/0.5ML SOAJ Inject 1 mg into the skin once a week. 2 mL 0   spironolactone (ALDACTONE) 25 MG tablet TAKE 1 TABLET (25 MG TOTAL) BY MOUTH DAILY. 30 tablet 5   No current facility-administered medications for this visit.    Allergies:   Cephalexin and Penicillins   Social History:  The patient  reports that he has never smoked. He has never used smokeless tobacco. He reports that he does not drink alcohol and does not use drugs.   Family History:  The patient's family history includes Prostate cancer in his father.  ROS:  Please see the history of  present illness.    All other systems are reviewed and otherwise negative.   PHYSICAL EXAM:  VS:  There were no vitals taken for this visit. BMI: There is no height or weight on file to calculate BMI. Well nourished, well developed, in no acute distress HEENT: normocephalic, atraumatic Neck: no JVD, carotid bruits or masses Cardiac:  RRR; + extrasystoles noted, no significant murmurs, no rubs, or gallops Lungs: CTA b/l, no wheezing, rhonchi or rales Abd: soft, nontender MS: no deformity or atrophy Ext: no edema Skin: warm and dry, no rash Neuro:  No gross deficits appreciated Psych: euthymic mood, full affect    EKG:  Done today and reviewed by myself shows  SR 72bpm,manually measured QT 481m, QTc 479, PVCs, one couplet (one morphology)  02/28/2021: TTE  1. Left ventricular ejection fraction, by estimation, is 55 to 60%. The  left ventricle has normal function. The left ventricle has no regional  wall motion abnormalities. There is mild left ventricular hypertrophy.  Left ventricular diastolic parameters  were normal.   2. Right ventricular systolic function is normal. The right ventricular  size is normal. Tricuspid regurgitation signal is inadequate for assessing  PA pressure.   3. Left atrial size was mildly dilated.   4. The mitral valve is normal in structure. Trivial mitral valve  regurgitation. No evidence of mitral stenosis.   5. The aortic valve is tricuspid. Aortic valve regurgitation is not  visualized. No aortic stenosis is present.    03/03/2016: TTE Study Conclusions  - Left ventricle: The cavity size was moderately dilated. Wall    thickness was increased in a pattern of mild LVH. Systolic    function was severely reduced. The estimated ejection fraction    was in the range of 25% to 30%. Diffuse hypokinesis.  - Aortic root: The aortic root was mildly dilated.  - Mitral valve: There was mild regurgitation.  - Left atrium: The atrium was severely dilated.   - Right ventricle: The cavity size was mildly dilated.  - Right atrium: The atrium was mildly dilated.  - Pulmonary arteries: PA peak pressure: 35 mm Hg (S).   Recent Labs: No results found for requested labs within last 365 days.  No results found for requested labs within last 365 days.   CrCl cannot be calculated (Patient's most recent lab result is older than the maximum 21 days allowed.).   Wt Readings from Last 3 Encounters:  12/09/21 269 lb 6.4 oz (122.2 kg)  03/07/21 256 lb (116.1 kg)  02/12/21 253 lb 12.8 oz (115.1 kg)     Other studies reviewed: Additional studies/records reviewed today include: summarized above  ASSESSMENT AND PLAN:  Persistent Afib CHA2DS2Vasc is 2, on Eliquis, appropriately dosed Low/no % burden by symptoms Stable QTc on Tikosyn Update his labs Med list  reviewed Tikosyn teaching re-enforced  NICM No symptoms or exam findings of volume OL Recovered LVEF by his last echo  HTN Looks good  4. New PVCs Place 3 day monitor for burden Labs/lytes Echo  5. Secondary hypercoagulable state     Disposition: 4 mo, sooner If needed.  Current medicines are reviewed at length with the patient today.  The patient did not have any concerns regarding medicines.  Venetia Night, PA-C 03/19/2022 6:33 AM     [address] [place] [id number] [phone] (office)  [phone] (fax)

## 2022-03-19 NOTE — Progress Notes (Unsigned)
Applied a 3 day ZIo XT monitor to patient in the office  Dr Lovena Le to read

## 2022-03-21 ENCOUNTER — Other Ambulatory Visit: Payer: Self-pay | Admitting: Student

## 2022-03-21 ENCOUNTER — Other Ambulatory Visit: Payer: Self-pay | Admitting: Internal Medicine

## 2022-03-21 ENCOUNTER — Other Ambulatory Visit: Payer: Self-pay | Admitting: Physician Assistant

## 2022-03-24 NOTE — Telephone Encounter (Signed)
Last appointment 12/09/2021.  No future appointments.

## 2022-04-02 ENCOUNTER — Other Ambulatory Visit: Payer: Self-pay | Admitting: *Deleted

## 2022-04-02 MED ORDER — CARVEDILOL 25 MG PO TABS: 50.0000 mg | ORAL_TABLET | Freq: Two times a day (BID) | ORAL | 0 refills | Status: DC

## 2022-04-16 ENCOUNTER — Ambulatory Visit (HOSPITAL_COMMUNITY): Payer: 59 | Attending: Internal Medicine

## 2022-04-16 ENCOUNTER — Encounter: Payer: Self-pay | Admitting: Internal Medicine

## 2022-04-16 ENCOUNTER — Ambulatory Visit (INDEPENDENT_AMBULATORY_CARE_PROVIDER_SITE_OTHER): Payer: 59 | Admitting: Internal Medicine

## 2022-04-16 VITALS — BP 112/78 | HR 64 | Ht 70.0 in | Wt 274.8 lb

## 2022-04-16 DIAGNOSIS — I493 Ventricular premature depolarization: Secondary | ICD-10-CM | POA: Insufficient documentation

## 2022-04-16 DIAGNOSIS — I4819 Other persistent atrial fibrillation: Secondary | ICD-10-CM

## 2022-04-16 DIAGNOSIS — R9431 Abnormal electrocardiogram [ECG] [EKG]: Secondary | ICD-10-CM | POA: Diagnosis not present

## 2022-04-16 DIAGNOSIS — I10 Essential (primary) hypertension: Secondary | ICD-10-CM | POA: Insufficient documentation

## 2022-04-16 LAB — ECHOCARDIOGRAM COMPLETE
Area-P 1/2: 2.53 cm2
S' Lateral: 3.9 cm

## 2022-04-16 NOTE — Patient Instructions (Signed)

## 2022-04-16 NOTE — Progress Notes (Signed)
HPI [name] returns today for followup. He is a pleasant 57 yo man with a tachy induced CM, chronic systolic heart failure, PAF, and HTN. He remains active. He has not had symptomatic atrial fib. No syncope or chest pain. No sob. His bp's have been reviewed and look good. He admits to some dietary indiscretion. He has had some problems with nasal congestion.  Allergies  Allergen Reactions   Cephalexin Swelling   Penicillins Swelling    Lip swelling Has patient had a PCN reaction causing immediate rash, facial/tongue/throat swelling, SOB or lightheadedness with hypotension: Yes Has patient had a PCN reaction causing severe rash involving mucus membranes or skin necrosis: No Has patient had a PCN reaction that required hospitalization No Has patient had a PCN reaction occurring within the last 10 years: Yes If all of the above answers are "NO", then may proceed with Cephalosporin use.     Current Outpatient Medications  Medication Sig Dispense Refill   atorvastatin (LIPITOR) 40 MG tablet TAKE 1 TABLET BY MOUTH EVERY DAY 90 tablet 3   carvedilol (COREG) 25 MG tablet Take 2 tablets (50 mg total) by mouth 2 (two) times daily with a meal. 360 tablet 0   diltiazem (CARDIZEM CD) 240 MG 24 hr capsule TAKE 1 CAPSULE BY MOUTH EVERY DAY 90 capsule 3   dofetilide (TIKOSYN) 500 MCG capsule Take 1 capsule (500 mcg total) by mouth 2 (two) times daily. 180 capsule 3   ELIQUIS 5 MG TABS tablet TAKE 1 TABLET BY MOUTH TWICE A DAY 60 tablet 5   magnesium oxide (MAG-OX) 400 (240 Mg) MG tablet TAKE 1 TABLET BY MOUTH EVERY DAY 90 tablet 3   olmesartan (BENICAR) 40 MG tablet TAKE 1 TABLET BY MOUTH EVERY DAY 90 tablet 3   Semaglutide-Weight Management (WEGOVY) 0.25 MG/0.5ML SOAJ Inject 0.25 mg into the skin once a week. 2 mL 0   Semaglutide-Weight Management (WEGOVY) 0.5 MG/0.5ML SOAJ Inject 0.5 mg into the skin once a week. 2 mL 0   Semaglutide-Weight Management (WEGOVY) 1 MG/0.5ML SOAJ Inject 1 mg  into the skin once a week. 2 mL 0   spironolactone (ALDACTONE) 25 MG tablet TAKE 1 TABLET (25 MG TOTAL) BY MOUTH DAILY. 90 tablet 1   apixaban (ELIQUIS) 5 MG TABS tablet Take 5 mg by mouth 2 (two) times daily.     No current facility-administered medications for this visit.     Past Medical History:  Diagnosis Date   Atrial fibrillation, persistent (HCC)    Atrial flutter (HCC)    Chronic renal insufficiency    Baseline Cr around 1.2, likely secondary to HTN   Dilated cardiomyopathy ([place])    Hx of-resolves on echo 2008-EF 60-70%   Diverticulosis    Hyperlipidemia    Hypertension    Left ventricular dysfunction    ejection fraction 25-30% by 2-D echo   Nephrolithiasis 01/19/2010   Calcium oxalate stones   Tubular adenoma of colon     ROS:   All systems reviewed and negative except as noted in the HPI.   Past Surgical History:  Procedure Laterality Date   CARDIOVERSION N/A 07/01/2015   Procedure: CARDIOVERSION;  Surgeon: Sanda Klein, MD;  Location: [facility] ENDOSCOPY;  Service: Cardiovascular;  Laterality: N/A;   TEE WITHOUT CARDIOVERSION N/A 07/01/2015   Procedure: TRANSESOPHAGEAL ECHOCARDIOGRAM (TEE);  Surgeon: Sanda Klein, MD;  Location: [facility] ENDOSCOPY;  Service: Cardiovascular;  Laterality: N/A;   WISDOM TOOTH EXTRACTION  Family History  Problem Relation Age of Onset   Prostate cancer Father    Rectal cancer Neg Hx    Stomach cancer Neg Hx      Social History   Socioeconomic History   Marital status: Married    Spouse name: Not on file   Number of children: Not on file   Years of education: Not on file   Highest education level: Not on file  Occupational History   Not on file  Tobacco Use   Smoking status: Never   Smokeless tobacco: Never  Substance and Sexual Activity   Alcohol use: No    Alcohol/week: 0.0 standard drinks of alcohol   Drug use: No   Sexual activity: Not on file  Other Topics Concern   Not on file  Social History Narrative    Married, 2 daughters ages 53,20 (2012). Exercises regularly.   Social Determinants of Health   Financial Resource Strain: Not on file  Food Insecurity: Not on file  Transportation Needs: No Transportation Needs (12/09/2021)   PRAPARE - Hydrologist (Medical): No    Lack of Transportation (Non-Medical): No  Physical Activity: Not on file  Stress: No Stress Concern Present (12/09/2021)   [place]    Feeling of Stress : Not at all  Social Connections: Not on file  Intimate Partner Violence: Not on file     BP 112/78   Pulse 64   Ht 5\' 10"  (1.778 m)   Wt 274 lb 12.8 oz (124.6 kg)   SpO2 97%   BMI 39.43 kg/m   Physical Exam:  Well appearing NAD HEENT: Unremarkable Neck:  No JVD, no thyromegally Lymphatics:  No adenopathy Back:  No CVA tenderness Lungs:  Clear HEART:  Regular rate rhythm, no murmurs, no rubs, no clicks Abd:  soft, positive bowel sounds, no organomegally, no rebound, no guarding Ext:  2 plus pulses, no edema, no cyanosis, no clubbing Skin:  No rashes no nodules Neuro:  CN II through XII intact, motor grossly intact  EKG - nsr  DEVICE  Normal device function.  See PaceArt for details.   Assess/Plan:  1. PAF - he is maintaining NSR. No change in meds. Continue dofetilide 2. LV dysfunction - he has class 1 symptoms and I suspect that his EF has normalized. He will continue his current meds. 3. HTN - his BP has been much better at home though a little high here. 4. Obesity - he has not lost weight. I asked him to lose 10 pounds.   Carleene Overlie Aylssa Herrig,MD

## 2022-04-21 ENCOUNTER — Other Ambulatory Visit (HOSPITAL_BASED_OUTPATIENT_CLINIC_OR_DEPARTMENT_OTHER): Payer: Self-pay

## 2022-04-27 ENCOUNTER — Ambulatory Visit (INDEPENDENT_AMBULATORY_CARE_PROVIDER_SITE_OTHER): Payer: 59

## 2022-04-27 ENCOUNTER — Other Ambulatory Visit: Payer: Self-pay

## 2022-04-27 VITALS — BP 115/80 | HR 64 | Temp 98.3°F | Resp 20 | Ht 70.0 in | Wt 279.4 lb

## 2022-04-27 DIAGNOSIS — I110 Hypertensive heart disease with heart failure: Secondary | ICD-10-CM | POA: Diagnosis not present

## 2022-04-27 DIAGNOSIS — Z7901 Long term (current) use of anticoagulants: Secondary | ICD-10-CM | POA: Diagnosis not present

## 2022-04-27 DIAGNOSIS — I10 Essential (primary) hypertension: Secondary | ICD-10-CM

## 2022-04-27 DIAGNOSIS — I480 Paroxysmal atrial fibrillation: Secondary | ICD-10-CM

## 2022-04-27 DIAGNOSIS — I5022 Chronic systolic (congestive) heart failure: Secondary | ICD-10-CM | POA: Diagnosis not present

## 2022-04-27 MED ORDER — DICLOFENAC SODIUM 1 % EX GEL: 2.0000 g | Freq: Four times a day (QID) | CUTANEOUS | 0 refills | Status: DC

## 2022-04-27 NOTE — Progress Notes (Signed)
   CC: 3 m f/u  HPI:  [name] is a 57 y.o. with past medical history as below who presents for routine follow up. See detailed assessment and plan for HPI.  Past Medical History:  Diagnosis Date   Atrial fibrillation, persistent    Atrial flutter    Chronic renal insufficiency    Baseline Cr around 1.2, likely secondary to HTN   Dilated cardiomyopathy    Hx of-resolves on echo 2008-EF 60-70%   Diverticulosis    Hyperlipidemia    Hypertension    Left ventricular dysfunction    ejection fraction 25-30% by 2-D echo   Nephrolithiasis 01/19/2010   Calcium oxalate stones   Tubular adenoma of colon    Review of Systems:  See detailed assessment and plan for pertinent ROS.  Physical Exam:  Vitals:   04/27/22 0846  BP: 115/80  Pulse: 64  Resp: 20  Temp: 98.3 F (36.8 C)  TempSrc: Oral  SpO2: 100%  Weight: 279 lb 6.4 oz (126.7 kg)  Height: 5\' 10"  (1.778 m)   Physical Exam Constitutional:      General: He is not in acute distress. HENT:     Head: Normocephalic and atraumatic.  Eyes:     Extraocular Movements: Extraocular movements intact.  Cardiovascular:     Rate and Rhythm: Normal rate and regular rhythm.     Heart sounds: No murmur heard. Pulmonary:     Breath sounds: No wheezing, rhonchi or rales.  Musculoskeletal:     Right lower leg: No edema.     Left lower leg: No edema.  Neurological:     Mental Status: He is alert and oriented to person, place, and time.  Psychiatric:        Mood and Affect: Mood normal.        Behavior: Behavior normal.      Assessment & Plan:   See Encounters Tab for problem based charting.  Essential hypertension Patient presents with history of hypertension.  Blood pressure today is 115/80.  Denies any chest pain, shortness of breath, vision changes, headaches, dizziness.  Denies any unwanted side effects of medications.  Recent BMP within normal limits. -Continue diltiazem 1040 mg daily -Continue olmesartan 40 mg  daily -Continue spironolactone 25 mg daily -Continue carvedilol 37.5 mg twice daily  Chronic HFrEF (heart failure with reduced ejection fraction) Patient presents with history of HFrEF in 2018 though echo in 2024 demonstrates normalized EF.  Denies shortness of breath, chest pains, swelling.  Exam with clear lungs and no lower extremity edema.  Patient denies PND or orthopnea. -Continue GDMT -Will hold off on SGLT2i given likely minimal benefit -Continue follow-up with cardiology  Atrial fibrillation [facility]) Patient presents with history of atrial fibrillation on dofetilide and Eliquis.  He denies any symptoms of atrial fibrillation including flutters or lightheadedness.  He recently saw electrophysiology who recommended continuation of current therapies.  Patient endorses good medication compliance.  Regular rate and rhythm on exam today. -Continue dofetilide 500 mg twice daily -Continue Eliquis 5 mg twice daily  Patient discussed with Dr. Oswaldo Done

## 2022-04-27 NOTE — Assessment & Plan Note (Addendum)
Patient presents with history of atrial fibrillation on dofetilide and Eliquis.  He denies any symptoms of atrial fibrillation including flutters or lightheadedness.  He recently saw electrophysiology who recommended continuation of current therapies.  Patient endorses good medication compliance.  Regular rate and rhythm on exam today. -Continue dofetilide 500 mg twice daily -Continue Eliquis 5 mg twice daily

## 2022-04-27 NOTE — Assessment & Plan Note (Signed)
Patient presents with history of hypertension.  Blood pressure today is 115/80.  Denies any chest pain, shortness of breath, vision changes, headaches, dizziness.  Denies any unwanted side effects of medications.  Recent BMP within normal limits. -Continue diltiazem 1040 mg daily -Continue olmesartan 40 mg daily -Continue spironolactone 25 mg daily -Continue carvedilol 37.5 mg twice daily

## 2022-04-27 NOTE — Assessment & Plan Note (Signed)
Patient presents with history of HFrEF in 2018 though echo in 2024 demonstrates normalized EF.  Denies shortness of breath, chest pains, swelling.  Exam with clear lungs and no lower extremity edema.  Patient denies PND or orthopnea. -Continue GDMT -Will hold off on SGLT2i given likely minimal benefit -Continue follow-up with cardiology

## 2022-04-27 NOTE — Patient Instructions (Signed)
Mr.Kortney Sampson Si, it was a pleasure seeing you today!  Today we discussed: Continue taking your blood pressure and atrial fibrillation medications as prescribed. Apply voltaren gel to your arm as needed. Call the clinic if you arm pain does not improve.  I have ordered the following labs today:  Lab Orders  No laboratory test(s) ordered today     Tests ordered today:  none  Referrals ordered today:   Referral Orders  No referral(s) requested today     I have ordered the following medication/changed the following medications:   Stop the following medications: There are no discontinued medications.   Start the following medications: Meds ordered this encounter  Medications   diclofenac Sodium (VOLTAREN) 1 % GEL    Sig: Apply 2 g topically 4 (four) times daily.    Dispense:  350 g    Refill:  0     Follow-up: 1 year   Please make sure to arrive 15 minutes prior to your next appointment. If you arrive late, you may be asked to reschedule.   We look forward to seeing you next time. Please call our clinic at [phone] if you have any questions or concerns. The best time to call is Monday-Friday from 9am-4pm, but there is someone available 24/7. If after hours or the weekend, call the main hospital number and ask for the Internal Medicine Resident On-Call. If you need medication refills, please notify your pharmacy one week in advance and they will send [place] a request.  Thank you for letting us take part in your care. Wishing you the best!  Thank you, Adron Bene, MD

## 2022-05-01 NOTE — Progress Notes (Signed)
Internal Medicine Clinic Attending  Case discussed with Dr. White  At the time of the visit.  We reviewed the resident's history and exam and pertinent patient test results.  I agree with the assessment, diagnosis, and plan of care documented in the resident's note.  

## 2022-05-01 NOTE — Addendum Note (Signed)
Addended by: Carlynn Purl C on: 05/01/2022 12:29 PM   Modules accepted: Level of Service

## 2022-05-06 NOTE — Telephone Encounter (Signed)
Patient was just seen by Dr. Cliffton Asters on 04/27/22 and he instructed the patient to return in one year.  Does he still need to be seen in the next month?  Please advise.

## 2022-05-10 ENCOUNTER — Other Ambulatory Visit: Payer: Self-pay

## 2022-05-24 ENCOUNTER — Other Ambulatory Visit: Payer: Self-pay

## 2022-06-01 ENCOUNTER — Other Ambulatory Visit: Payer: Self-pay | Admitting: *Deleted

## 2022-06-01 DIAGNOSIS — E785 Hyperlipidemia, unspecified: Secondary | ICD-10-CM

## 2022-06-02 ENCOUNTER — Other Ambulatory Visit: Payer: Self-pay | Admitting: Student

## 2022-06-02 DIAGNOSIS — I10 Essential (primary) hypertension: Secondary | ICD-10-CM

## 2022-06-02 DIAGNOSIS — E785 Hyperlipidemia, unspecified: Secondary | ICD-10-CM

## 2022-06-02 MED ORDER — ATORVASTATIN CALCIUM 80 MG PO TABS: 80.0000 mg | ORAL_TABLET | Freq: Every day | ORAL | 3 refills | Status: DC

## 2022-07-07 NOTE — Progress Notes (Deleted)
Cardiology Office Note Date:  07/07/2022  Patient ID:  Robert White, Robert White 10-26-1965, MRN [id number] PCP:  Rocky Morel, DO  Cardiologist:  Dr. Allyson Sabal Electrophysiologist: Dr. Ladona Ridgel     Chief Complaint: annual visit, pre-op  History of Present Illness: Robert White is a 57 y.o. male with history of HTN, NICM (tachy-mediated), HLD, obesity and Afib  He comes in today to be seen for Dr. Ladona Ridgel, last seen by him Jan 2022, doing well, very active, working 2 jobs, suspected his LVEF had improved.  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 No CP, palpitations or cardiac awareness Reports excellent exertional capacity No dizzy spells, near syncope or syncope No SOB, DOE No bleeding or signs of bleeding Stable QTc Labs and updated echo planned No changes were made  LVEF recovered to 55-60%, no WMA, RV OK, no VHD  TODAY He feels well. No CP, palpitations or cardiac awareness of any kind He does not think he has had any AFib No SOB or exertional intolerances Reports good medication compliance No bleeding, signs of bleeding No dizzy spells, near syncope or syncopne  Afib hx Diagnosed 2017 Tikosyn started 2018,  is current    Past Medical History:  Diagnosis Date   Atrial fibrillation, persistent (HCC)    Atrial flutter (HCC)    Chronic renal insufficiency    Baseline Cr around 1.2, likely secondary to HTN   Dilated cardiomyopathy (HCC)    Hx of-resolves on echo 2008-EF 60-70%   Diverticulosis    Hyperlipidemia    Hypertension    Left ventricular dysfunction    ejection fraction 25-30% by 2-D echo   Nephrolithiasis 01/19/2010   Calcium oxalate stones   Tubular adenoma of colon     Past Surgical History:  Procedure Laterality Date   CARDIOVERSION N/A 07/01/2015   Procedure: CARDIOVERSION;   Surgeon: Thurmon Fair, MD;  Location: [facility] ENDOSCOPY;  Service: Cardiovascular;  Laterality: N/A;   TEE WITHOUT CARDIOVERSION N/A 07/01/2015   Procedure: TRANSESOPHAGEAL ECHOCARDIOGRAM (TEE);  Surgeon: Thurmon Fair, MD;  Location: [facility] ENDOSCOPY;  Service: Cardiovascular;  Laterality: N/A;   WISDOM TOOTH EXTRACTION      Current Outpatient Medications  Medication Sig Dispense Refill   apixaban (ELIQUIS) 5 MG TABS tablet Take 5 mg by mouth 2 (two) times daily.     atorvastatin (LIPITOR) 80 MG tablet Take 1 tablet (80 mg total) by mouth daily. 90 tablet 3   carvedilol (COREG) 25 MG tablet Take 2 tablets (50 mg total) by mouth 2 (two) times daily with a meal. 360 tablet 0   diclofenac Sodium (VOLTAREN) 1 % GEL APPLY 2 GRAMS TO AFFECTED AREA 4 TIMES A DAY 300 g 1   diltiazem (CARDIZEM CD) 240 MG 24 hr capsule TAKE 1 CAPSULE BY MOUTH EVERY DAY 90 capsule 3   dofetilide (TIKOSYN) 500 MCG capsule Take 1 capsule (500 mcg total) by mouth 2 (two) times daily. 180 capsule 3   ELIQUIS 5 MG TABS tablet TAKE 1 TABLET BY MOUTH TWICE A DAY 60 tablet 5   magnesium oxide (MAG-OX) 400 (240 Mg) MG tablet TAKE 1 TABLET BY MOUTH EVERY DAY 90 tablet 3   olmesartan (BENICAR) 40 MG tablet TAKE 1 TABLET BY MOUTH EVERY DAY 90 tablet 3   Semaglutide-Weight Management (WEGOVY) 0.25 MG/0.5ML SOAJ  Inject 0.25 mg into the skin once a week. 2 mL 0   Semaglutide-Weight Management (WEGOVY) 0.5 MG/0.5ML SOAJ Inject 0.5 mg into the skin once a week. 2 mL 0   Semaglutide-Weight Management (WEGOVY) 1 MG/0.5ML SOAJ Inject 1 mg into the skin once a week. 2 mL 0   spironolactone (ALDACTONE) 25 MG tablet TAKE 1 TABLET (25 MG TOTAL) BY MOUTH DAILY. 90 tablet 1   No current facility-administered medications for this visit.    Allergies:   Cephalexin and Penicillins   Social History:  The patient  reports that he has never smoked. He has never used smokeless tobacco. He reports that he does not drink alcohol and does not use drugs.    Family History:  The patient's family history includes Prostate cancer in his father.  ROS:  Please see the history of present illness.    All other systems are reviewed and otherwise negative.   PHYSICAL EXAM:  VS:  There were no vitals taken for this visit. BMI: There is no height or weight on file to calculate BMI. Well nourished, well developed, in no acute distress HEENT: normocephalic, atraumatic Neck: no JVD, carotid bruits or masses Cardiac:  RRR; + extrasystoles noted, no significant murmurs, no rubs, or gallops Lungs: CTA b/l, no wheezing, rhonchi or rales Abd: soft, nontender MS: no deformity or atrophy Ext: no edema Skin: warm and dry, no rash Neuro:  No gross deficits appreciated Psych: euthymic mood, full affect    EKG:  Done today and reviewed by myself shows  SR 72bpm,manually measured QT , QTc 479, PVCs, one couplet (one morphology)  02/28/2021: TTE  1. Left ventricular ejection fraction, by estimation, is 55 to 60%. The  left ventricle has normal function. The left ventricle has no regional  wall motion abnormalities. There is mild left ventricular hypertrophy.  Left ventricular diastolic parameters  were normal.   2. Right ventricular systolic function is normal. The right ventricular  size is normal. Tricuspid regurgitation signal is inadequate for assessing  PA pressure.   3. Left atrial size was mildly dilated.   4. The mitral valve is normal in structure. Trivial mitral valve  regurgitation. No evidence of mitral stenosis.   5. The aortic valve is tricuspid. Aortic valve regurgitation is not  visualized. No aortic stenosis is present.    03/03/2016: TTE Study Conclusions  - Left ventricle: The cavity size was moderately dilated. Wall    thickness was increased in a pattern of mild LVH. Systolic    function was severely reduced. The estimated ejection fraction    was in the range of 25% to 30%. Diffuse hypokinesis.  - Aortic root: The aortic  root was mildly dilated.  - Mitral valve: There was mild regurgitation.  - Left atrium: The atrium was severely dilated.  - Right ventricle: The cavity size was mildly dilated.  - Right atrium: The atrium was mildly dilated.  - Pulmonary arteries: PA peak pressure: 35 mm Hg (S).   Recent Labs: 03/19/2022: BUN 24; Creatinine, Ser 1.57; Hemoglobin 15.4; Magnesium 1.9; Platelets 247; Potassium 4.4; Sodium 138  No results found for requested labs within last 365 days.   CrCl cannot be calculated (Patient's most recent lab result is older than the maximum 21 days allowed.).   Wt Readings from Last 3 Encounters:  04/27/22 279 lb 6.4 oz (126.7 kg)  04/16/22 274 lb 12.8 oz (124.6 kg)  03/19/22 275 lb 6.4 oz (124.9 kg)     Other studies  reviewed: Additional studies/records reviewed today include: summarized above  ASSESSMENT AND PLAN:  Persistent Afib CHA2DS2Vasc is 2, on Eliquis, appropriately dosed Low/no % burden by symptoms Stable QTc on Tikosyn Update his labs Med list reviewed Tikosyn teaching re-enforced  NICM No symptoms or exam findings of volume OL Recovered LVEF by his last echo  HTN *** Looks good  4. New PVCs ***  5. Secondary hypercoagulable state     Disposition: ***   Current medicines are reviewed at length with the patient today.  The patient did not have any concerns regarding medicines.  Robert Fredrickson, PA-C 07/07/2022 10:16 AM     [facility] [address] [place] [address] [phone] (office)  [phone] (fax)

## 2022-07-09 ENCOUNTER — Ambulatory Visit: Payer: 59 | Admitting: Physician Assistant

## 2022-08-01 ENCOUNTER — Other Ambulatory Visit: Payer: Self-pay | Admitting: Student

## 2022-08-25 ENCOUNTER — Other Ambulatory Visit (HOSPITAL_BASED_OUTPATIENT_CLINIC_OR_DEPARTMENT_OTHER): Payer: Self-pay

## 2022-09-06 ENCOUNTER — Other Ambulatory Visit: Payer: Self-pay | Admitting: Internal Medicine

## 2022-09-06 DIAGNOSIS — I4819 Other persistent atrial fibrillation: Secondary | ICD-10-CM

## 2022-09-07 NOTE — Telephone Encounter (Signed)
Prescription refill request for Eliquis received. Indication:afib Last office visit:3/24 Scr:1.57  2/24 Age: 57 Weight:126.7  kg  Prescription refilled

## 2022-09-17 NOTE — Progress Notes (Signed)
Aleen Sells D.[facility] [address] [place] [address] Phone: [phone]   Assessment and Plan:     1. Bilateral foot pain 2. Bilateral plantar fasciitis  -Acute, uncomplicated, initial sports medicine visit - Consistent with bilateral plantar fasciitis likely caused from patient walking and on his feet on concrete and [place] - Patient is already had moderate improvement in symptoms with relative rest, wearing supportive tennis shoes, as needed topical cream.  I do not feel that CSI's are necessary at today's visit - Start HEP for plan fasciitis - Recommend continuing to wear supportive tennis shoes and could get gel inserts for further cushioning - Do not recommend NSAID use due to chronic anticoagulation on Eliquis  Pertinent previous records reviewed include none   Follow Up: As needed if no improvement or worsening of symptoms.  Could obtain bilateral feet x-rays and consider plantar fasciitis CSI   Subjective:   I, Moenique Parris, am serving as a Neurosurgeon for Doctor Richardean Sale  Chief Complaint: bilat foot pain   HPI:   09/18/2022 Patient is a 57 year old male complaining of bilat foot pain. Patient state  Relevant Historical Information: Chronic anticoagulation on Eliquis with past medical history atrial fibrillation, hypertension, CKD  Additional pertinent review of systems negative.   Current Outpatient Medications:    apixaban (ELIQUIS) 5 MG TABS tablet, Take 5 mg by mouth 2 (two) times daily., Disp: , Rfl:    atorvastatin (LIPITOR) 80 MG tablet, Take 1 tablet (80 mg total) by mouth daily., Disp: 90 tablet, Rfl: 3   carvedilol (COREG) 25 MG tablet, Take 2 tablets (50 mg total) by mouth 2 (two) times daily with a meal., Disp: 360 tablet, Rfl: 0   diclofenac Sodium (VOLTAREN) 1 % GEL, APPLY 2 GRAMS TO AFFECTED AREA 4 TIMES A DAY, Disp: 300 g, Rfl: 1   diltiazem (CARDIZEM CD) 240 MG 24  hr capsule, TAKE 1 CAPSULE BY MOUTH EVERY DAY, Disp: 90 capsule, Rfl: 3   dofetilide (TIKOSYN) 500 MCG capsule, Take 1 capsule (500 mcg total) by mouth 2 (two) times daily., Disp: 180 capsule, Rfl: 3   ELIQUIS 5 MG TABS tablet, TAKE 1 TABLET BY MOUTH TWICE A DAY, Disp: 60 tablet, Rfl: 5   magnesium oxide (MAG-OX) 400 (240 Mg) MG tablet, TAKE 1 TABLET BY MOUTH EVERY DAY, Disp: 90 tablet, Rfl: 3   olmesartan (BENICAR) 40 MG tablet, TAKE 1 TABLET BY MOUTH EVERY DAY, Disp: 90 tablet, Rfl: 3   Semaglutide-Weight Management (WEGOVY) 0.25 MG/0.5ML SOAJ, Inject 0.25 mg into the skin once a week., Disp: 2 mL, Rfl: 0   Semaglutide-Weight Management (WEGOVY) 0.5 MG/0.5ML SOAJ, Inject 0.5 mg into the skin once a week., Disp: 2 mL, Rfl: 0   Semaglutide-Weight Management (WEGOVY) 1 MG/0.5ML SOAJ, Inject 1 mg into the skin once a week., Disp: 2 mL, Rfl: 0   spironolactone (ALDACTONE) 25 MG tablet, TAKE 1 TABLET (25 MG TOTAL) BY MOUTH DAILY., Disp: 90 tablet, Rfl: 1   Objective:     Vitals:   09/18/22 1308  BP: 120/84  Pulse: 75  SpO2: 95%  Weight: 258 lb (117 kg)  Height: 5\' 10"  (1.778 m)      Body mass index is 37.02 kg/m.    Physical Exam:    Gen: Appears well, nad, nontoxic and pleasant Psych: Alert and oriented, appropriate mood and affect Neuro: sensation intact, strength is 5/5 with df/pf/inv/ev, muscle  tone wnl Skin: no susupicious lesions or rashes  Bilateral feet/ankle:  No deformity, no swelling or effusion TTP mildly anterior medial calcaneus and proximal plantar fascial medial band NTTP over fibular head, lat mal, medial mal, achilles, navicular, base of 5th, ATFL, CFL, deltoid,   or midfoot ROM DF 30, PF 45, inv/ev intact Negative ant drawer, talar tilt, rotation test, squeeze test. Neg thompson No pain with resisted inversion or eversion    Electronically signed by:  Aleen Sells D.[facility] 1:24 PM 09/18/22

## 2022-09-18 ENCOUNTER — Ambulatory Visit: Payer: 59 | Admitting: Sports Medicine

## 2022-09-18 VITALS — BP 120/84 | HR 75 | Ht 70.0 in | Wt 258.0 lb

## 2022-09-18 DIAGNOSIS — M79672 Pain in left foot: Secondary | ICD-10-CM | POA: Diagnosis not present

## 2022-09-18 DIAGNOSIS — M722 Plantar fascial fibromatosis: Secondary | ICD-10-CM | POA: Diagnosis not present

## 2022-09-18 DIAGNOSIS — M79671 Pain in right foot: Secondary | ICD-10-CM | POA: Diagnosis not present

## 2022-09-18 NOTE — Patient Instructions (Addendum)
Good to see you  Plantar fasciitis exercises given Recommend wearing comfortable tennis shoes and getting gel insert  Continue to use topical creams Follow up as needed    Plantar Fasciitis Rehab Ask your health care provider which exercises are safe for you. Do exercises exactly as told by your health care provider and adjust them as directed. It is normal to feel mild stretching, pulling, tightness, or discomfort as you do these exercises. Stop right away if you feel sudden pain or your pain gets worse. Do not begin these exercises until told by your health care provider. Stretching and range-of-motion exercises These exercises warm up your muscles and joints and improve the movement and flexibility of your foot. These exercises also help to relieve pain. Plantar fascia stretch  Sit with your left / right leg crossed over your opposite knee. Hold your heel with one hand with that thumb near your arch. With your other hand, hold your toes and gently pull them back toward the top of your foot. You should feel a stretch on the base (bottom) of your toes, or the bottom of your foot (plantar fascia), or both. Hold this stretch for__________ seconds. Slowly release your toes and return to the starting position. Repeat __________ times. Complete this exercise __________ times a day. Gastrocnemius stretch, standing  This exercise is also called a calf (gastroc) stretch. It stretches the muscles in the back of the upper calf. Stand with your hands against a wall. Extend your left / right leg behind you, and bend your front knee slightly. Keeping your heels on the floor, your toes facing forward, and your back knee straight, shift your weight toward the wall. Do not arch your back. You should feel a gentle stretch in your upper calf. Hold this position for __________ seconds. Repeat __________ times. Complete this exercise __________ times a day. Soleus stretch, standing This exercise is also  called a calf (soleus) stretch. It stretches the muscles in the back of the lower calf. Stand with your hands against a wall. Extend your left / right leg behind you, and bend your front knee slightly. Keeping your heels on the floor and your toes facing forward, bend your back knee and shift your weight slightly over your back leg. You should feel a gentle stretch deep in your lower calf. Hold this position for __________ seconds. Repeat __________ times. Complete this exercise __________ times a day. Gastroc and soleus stretch, standing step This exercise stretches the muscles in the back of the lower leg. These muscles are in the upper calf (gastrocnemius) and the lower calf (soleus). Stand with the ball of your left / right foot on the front of a step. The ball of your foot is on the walking surface, right under your toes. Keep your other foot firmly on the same step. Hold on to the wall or a railing for balance. Slowly lift your other foot, allowing your body weight to press your heel down over the edge of the front of the step. Keep knee straight and unbent. You should feel a stretch in your calf. Hold this position for __________ seconds. Return both feet to the step. Repeat this exercise with a slight bend in your left / right knee. Repeat __________ times with your left / right knee straight and __________ times with your left / right knee bent. Complete this exercise __________ times a day. Balance exercise This exercise builds your balance and strength control of your arch to help take pressure  off your plantar fascia. Single leg stand If this exercise is too easy, you can try it with your eyes closed or while standing on a pillow. Without shoes, stand near a railing or in a doorway. You may hold on to the railing or door frame as needed. Stand on your left / right foot. Keep your big toe down on the floor and lift the arch of your foot. You should feel a stretch across the bottom of  your foot and your arch. Do not let your foot roll inward. Hold this position for __________ seconds. Repeat __________ times. Complete this exercise __________ times a day. This information is not intended to replace advice given to you by your health care provider. Make sure you discuss any questions you have with your health care provider. Document Revised: 10/19/2019 Document Reviewed: 10/19/2019 Elsevier Patient Education  2024 [employer].

## 2022-11-03 ENCOUNTER — Other Ambulatory Visit: Payer: Self-pay | Admitting: Internal Medicine

## 2022-11-03 DIAGNOSIS — N1831 Chronic kidney disease, stage 3a: Secondary | ICD-10-CM

## 2022-11-13 ENCOUNTER — Ambulatory Visit
Admission: RE | Admit: 2022-11-13 | Discharge: 2022-11-13 | Disposition: A | Discharge status: Home / Self Care | Payer: 59 | Source: Ambulatory Visit | Attending: Internal Medicine | Admitting: Internal Medicine

## 2022-11-13 DIAGNOSIS — N1831 Chronic kidney disease, stage 3a: Secondary | ICD-10-CM

## 2022-11-16 ENCOUNTER — Other Ambulatory Visit: Payer: Self-pay

## 2022-11-16 DIAGNOSIS — I10 Essential (primary) hypertension: Secondary | ICD-10-CM

## 2022-11-16 DIAGNOSIS — E785 Hyperlipidemia, unspecified: Secondary | ICD-10-CM

## 2022-11-16 MED ORDER — ATORVASTATIN CALCIUM 80 MG PO TABS
80.0000 mg | ORAL_TABLET | Freq: Every day | ORAL | 3 refills | Status: DC
Start: 2022-11-16 — End: 2023-06-09

## 2022-11-16 MED ORDER — OLMESARTAN MEDOXOMIL 40 MG PO TABS
40.0000 mg | ORAL_TABLET | Freq: Every day | ORAL | 3 refills | Status: DC
Start: 2022-11-16 — End: 2023-08-25

## 2022-11-19 ENCOUNTER — Telehealth: Payer: Self-pay | Admitting: Internal Medicine

## 2022-11-19 DIAGNOSIS — I4819 Other persistent atrial fibrillation: Secondary | ICD-10-CM

## 2022-11-19 MED ORDER — DOFETILIDE 500 MCG PO CAPS
500.0000 ug | ORAL_CAPSULE | Freq: Two times a day (BID) | ORAL | 1 refills | Status: DC
Start: 2022-11-19 — End: 2023-06-22

## 2022-11-19 NOTE — Telephone Encounter (Signed)
Pt's medication was sent to pt's pharmacy as requested. Confirmation received.  °

## 2022-11-19 NOTE — Telephone Encounter (Signed)
*  STAT* If patient is at the pharmacy, call can be transferred to refill team.   1. Which medications need to be refilled? (please list name of each medication and dose if known) dofetilide (TIKOSYN) 500 MCG capsule   2. Which pharmacy/location (including street and city if local pharmacy) is medication to be sent to?  [id number] - [place], [place] - [address] AT [address]    3. Do they need a 30 day or 90 day supply? 90

## 2022-11-19 NOTE — Telephone Encounter (Signed)
Pt c/o medication issue:  1. Name of Medication:   dofetilide (TIKOSYN) 500 MCG capsule    2. How are you currently taking this medication (dosage and times per day)?  Take 1 capsule (500 mcg total) by mouth 2 (two) times daily.       3. Are you having a reaction (difficulty breathing--STAT)? No  4. What is your medication issue? Pt is requesting a callback regarding him needing a Prior Auth to get his medication. Please advise

## 2022-11-20 ENCOUNTER — Telehealth: Payer: Self-pay | Admitting: Pharmacy Technician

## 2022-11-20 ENCOUNTER — Other Ambulatory Visit (HOSPITAL_COMMUNITY): Payer: Self-pay

## 2022-11-20 NOTE — Telephone Encounter (Signed)
Pharmacy Patient Advocate Encounter   Received notification from Pt Calls Messages that prior authorization for dofetilide is required/requested.   Insurance verification completed.   The patient is insured through  rx advance  .   Per test claim: PA required; PA submitted to above mentioned insurance via CoverMyMeds Key/confirmation #/EOC [id number] Status is pending

## 2022-11-20 NOTE — Telephone Encounter (Signed)
Pharmacy Patient Advocate Encounter  Received notification from  rx advance  that Prior Authorization for dofetilide has been APPROVED from 11/20/22 to 11/19/23   PA #/Case ID/Reference #: [id number]

## 2022-12-10 ENCOUNTER — Other Ambulatory Visit: Payer: Self-pay | Admitting: Internal Medicine

## 2022-12-10 DIAGNOSIS — N1831 Chronic kidney disease, stage 3a: Secondary | ICD-10-CM

## 2022-12-16 ENCOUNTER — Inpatient Hospital Stay
Admission: RE | Admit: 2022-12-16 | Discharge: 2022-12-16 | Discharge status: Home / Self Care | Payer: 59 | Source: Ambulatory Visit | Attending: Internal Medicine | Admitting: Internal Medicine

## 2022-12-16 DIAGNOSIS — N1831 Chronic kidney disease, stage 3a: Secondary | ICD-10-CM

## 2022-12-16 MED ORDER — IOPAMIDOL (ISOVUE-300) INJECTION 61%
500.0000 mL | Freq: Once | INTRAVENOUS | Status: AC | PRN
  Administered 2022-12-16: 80 mL via INTRAVENOUS

## 2023-01-16 ENCOUNTER — Other Ambulatory Visit: Payer: Self-pay | Admitting: Student

## 2023-01-21 ENCOUNTER — Other Ambulatory Visit: Payer: Self-pay | Admitting: Physician Assistant

## 2023-03-14 ENCOUNTER — Other Ambulatory Visit: Payer: Self-pay | Admitting: Internal Medicine

## 2023-03-14 DIAGNOSIS — I4819 Other persistent atrial fibrillation: Secondary | ICD-10-CM

## 2023-03-15 ENCOUNTER — Other Ambulatory Visit: Payer: Self-pay | Admitting: Internal Medicine

## 2023-03-15 NOTE — Telephone Encounter (Signed)
 Prescription refill request for Eliquis received. Indication:afib Last office visit:3/24 Scr:1.57  2/24 Age: 58 Weight:117  kg  Prescription refilled

## 2023-03-24 ENCOUNTER — Other Ambulatory Visit: Payer: Self-pay | Admitting: Internal Medicine

## 2023-03-25 MED ORDER — DILTIAZEM HCL ER COATED BEADS 240 MG PO CP24: 240.0000 mg | ORAL_CAPSULE | Freq: Every day | ORAL | 0 refills | Status: AC

## 2023-03-25 NOTE — Addendum Note (Signed)
 Addended by: Adriana Simas, Eulalio Reamy L on: 03/25/2023 03:15 PM   Modules accepted: Orders

## 2023-04-01 ENCOUNTER — Other Ambulatory Visit: Payer: Self-pay | Admitting: Internal Medicine

## 2023-04-01 NOTE — Telephone Encounter (Signed)
*  STAT* If patient is at the pharmacy, call can be transferred to refill team.   1. Which medications need to be refilled? (please list name of each medication and dose if known) Carvedilol   2. Would you like to learn more about the convenience, safety, & potential cost savings by using the [facility]?     3. Are you open to using the [facility] (Type [facility]. .   4. Which pharmacy/location (including street and city if local pharmacy) is medication to be sent to?[facility]  [place]   5. Do they need a 30 day or 90 day supply? 90 days and refills- please call in today

## 2023-05-12 NOTE — Progress Notes (Unsigned)
  Electrophysiology Office Note:   Date:  05/13/2023  ID:  Robert White, DOB 02-Oct-1965, MRN [id number]  Primary Cardiologist: None Electrophysiologist: Manya Sells, MD      History of Present Illness:   Robert White is a 58 y.o. male with h/o HTN, NICM (tachy-mediated), HLD, obesity, and AF seen today for routine electrophysiology followup.   Since last being seen in our clinic the patient reports doing very well. Overall, he denies chest pain, palpitations, dyspnea, PND, orthopnea, nausea, vomiting, dizziness, syncope, edema, weight gain, or early satiety. 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 Stays busy and walks 3-5 miles daily without issue.   Review of systems complete and found to be negative unless listed in HPI.   EP Information / Studies Reviewed:    EKG is ordered today. Personal review as below.  EKG Interpretation Date/Time:  Thursday May 13 2023 08:32:36 EDT Ventricular Rate:  88 PR Interval:  170 QRS Duration:  94 QT Interval:  376 QTC Calculation: 454 R Axis:   -19  Text Interpretation: Sinus rhythm with occasional Premature ventricular complexes Right atrial enlargement Nonspecific T wave abnormality Confirmed by Pilar Bridge [phone]) on 05/13/2023 8:37:09 AM    Arrhythmia/Device History No specialty comments available.   Physical Exam:   VS:  BP (!) 140/96 (BP Location: Left Arm, Patient Position: Sitting, Cuff Size: Large)   Pulse 86   Ht 5\' 10"  (1.778 m)   Wt 254 lb (115.2 kg)   SpO2 98%   BMI 36.45 kg/m    Wt Readings from Last 3 Encounters:  05/13/23 254 lb (115.2 kg)  09/18/22 258 lb (117 kg)  04/27/22 279 lb 6.4 oz (126.7 kg)     GEN: No acute distress NECK: No JVD; No carotid bruits CARDIAC: Regular rate and rhythm, no murmurs, rubs, gallops RESPIRATORY:  Clear to auscultation without rales, wheezing or rhonchi  ABDOMEN: Soft, non-tender, non-distended EXTREMITIES:  No edema; No deformity   ASSESSMENT AND  PLAN:    Persistent AF EKG today shows NSR with stable intervals.  Continue tikosyn  500 mcg BID. Reviewed need for more regular follow up Surveillance labs today.   HFrecEF No symptoms or exam findings of volume overload EF normal 03/2022  HTN Stable on current regimen   PVCs Continue coreg  37.5 mg BID   Follow up with EP APP in 6 months  Signed, Tylene Galla, PA-C

## 2023-05-13 ENCOUNTER — Encounter: Payer: Self-pay | Admitting: Student

## 2023-05-13 ENCOUNTER — Ambulatory Visit: Attending: Student | Admitting: Student

## 2023-05-13 VITALS — BP 140/96 | HR 86 | Ht 70.0 in | Wt 254.0 lb

## 2023-05-13 DIAGNOSIS — I493 Ventricular premature depolarization: Secondary | ICD-10-CM | POA: Diagnosis not present

## 2023-05-13 DIAGNOSIS — I4819 Other persistent atrial fibrillation: Secondary | ICD-10-CM

## 2023-05-13 DIAGNOSIS — I10 Essential (primary) hypertension: Secondary | ICD-10-CM | POA: Diagnosis not present

## 2023-05-13 DIAGNOSIS — I428 Other cardiomyopathies: Secondary | ICD-10-CM | POA: Diagnosis not present

## 2023-05-13 LAB — BASIC METABOLIC PANEL WITH GFR
BUN/Creatinine Ratio: 17 (ref 9–20)
BUN: 23 mg/dL (ref 6–24)
CO2: 23 mmol/L (ref 20–29)
Calcium: 9.2 mg/dL (ref 8.7–10.2)
Chloride: 101 mmol/L (ref 96–106)
Creatinine, Ser: 1.37 mg/dL — ABNORMAL HIGH (ref 0.76–1.27)
Glucose: 88 mg/dL (ref 70–99)
Potassium: 4.5 mmol/L (ref 3.5–5.2)
Sodium: 139 mmol/L (ref 134–144)
eGFR: 59 mL/min/1.73 — ABNORMAL LOW

## 2023-05-13 LAB — MAGNESIUM: Magnesium: 2 mg/dL (ref 1.6–2.3)

## 2023-05-13 NOTE — Patient Instructions (Signed)
 Medication Instructions:  Your physician recommends that you continue on your current medications as directed. Please refer to the Current Medication list given to you today.  *If you need a refill on your cardiac medications before your next appointment, please call your pharmacy*  Lab Work: BMET, MAG-TODAY If you have labs (blood work) drawn today and your tests are completely normal, you will receive your results only by: MyChart Message (if you have MyChart) OR A paper copy in the mail If you have any lab test that is abnormal or we need to change your treatment, we will call you to review the results.  Follow-Up: At [facility], you and your health needs are our priority.  As part of our continuing mission to provide you with exceptional heart care, our providers are all part of one team.  This team includes your primary Cardiologist (physician) and Advanced Practice Providers or APPs (Physician Assistants and Nurse Practitioners) who all work together to provide you with the care you need, when you need it.  Your next appointment:   6 month(s)  Provider:   Casimiro Needle "Mardelle Matte" Lanna Poche, PA-C       1st Floor: - Lobby - Registration  - Pharmacy  - Lab - Cafe  2nd Floor: - PV Lab - Diagnostic Testing (echo, CT, nuclear med)  3rd Floor: - Vacant  4th Floor: - TCTS (cardiothoracic surgery) - AFib Clinic - Structural Heart Clinic - Vascular Surgery  - Vascular Ultrasound  5th Floor: - HeartCare Cardiology (general and EP) - Clinical Pharmacy for coumadin, hypertension, lipid, weight-loss medications, and med management appointments    Valet parking services will be available as well.

## 2023-06-05 ENCOUNTER — Other Ambulatory Visit: Payer: Self-pay | Admitting: Internal Medicine

## 2023-06-09 ENCOUNTER — Other Ambulatory Visit: Payer: Self-pay

## 2023-06-09 ENCOUNTER — Telehealth: Payer: Self-pay | Admitting: Internal Medicine

## 2023-06-09 DIAGNOSIS — E785 Hyperlipidemia, unspecified: Secondary | ICD-10-CM

## 2023-06-09 MED ORDER — CARVEDILOL 25 MG PO TABS: 50.0000 mg | ORAL_TABLET | Freq: Two times a day (BID) | ORAL | 2 refills | Status: AC

## 2023-06-09 MED ORDER — ATORVASTATIN CALCIUM 80 MG PO TABS: 80.0000 mg | ORAL_TABLET | Freq: Every day | ORAL | 3 refills | Status: AC

## 2023-06-09 NOTE — Telephone Encounter (Signed)
 Pt c/o medication issue:  1. Name of Medication: carvedilol  (COREG ) 25 MG tablet   2. How are you currently taking this medication (dosage and times per day)?   TAKE 1 AND 1/2 TABLETS BY MOUTH TWICE A DAY    3. Are you having a reaction (difficulty breathing--STAT)? No  4. What is your medication issue? Patient stated the dosage amount is incorrect. Patient stated the correct dosage should be 2 tablets by mouth daily. Please advise.

## 2023-06-09 NOTE — Telephone Encounter (Signed)
 Pt made aware that Rx did get sent in wrong.  Aware I will get corrected and send in to his requested pharmacy, [facility].  Patient appreciates the help on this matter.  Forwarding to management for FYI

## 2023-06-20 ENCOUNTER — Other Ambulatory Visit: Payer: Self-pay | Admitting: Internal Medicine

## 2023-06-20 DIAGNOSIS — I4819 Other persistent atrial fibrillation: Secondary | ICD-10-CM

## 2023-07-13 ENCOUNTER — Ambulatory Visit: Admitting: Student

## 2023-07-13 VITALS — BP 135/92 | HR 75 | Temp 98.4°F | Ht 70.0 in | Wt 257.0 lb

## 2023-07-13 DIAGNOSIS — I10 Essential (primary) hypertension: Secondary | ICD-10-CM

## 2023-07-13 DIAGNOSIS — I480 Paroxysmal atrial fibrillation: Secondary | ICD-10-CM

## 2023-07-13 DIAGNOSIS — N183 Chronic kidney disease, stage 3 unspecified: Secondary | ICD-10-CM | POA: Diagnosis not present

## 2023-07-13 DIAGNOSIS — N1831 Chronic kidney disease, stage 3a: Secondary | ICD-10-CM

## 2023-07-13 DIAGNOSIS — M722 Plantar fascial fibromatosis: Secondary | ICD-10-CM

## 2023-07-13 DIAGNOSIS — I130 Hypertensive heart and chronic kidney disease with heart failure and stage 1 through stage 4 chronic kidney disease, or unspecified chronic kidney disease: Secondary | ICD-10-CM | POA: Diagnosis not present

## 2023-07-13 DIAGNOSIS — Z23 Encounter for immunization: Secondary | ICD-10-CM

## 2023-07-13 DIAGNOSIS — I5032 Chronic diastolic (congestive) heart failure: Secondary | ICD-10-CM | POA: Diagnosis not present

## 2023-07-13 NOTE — Assessment & Plan Note (Signed)
 Patient with bilateral plantar fasciitis that has been well-controlled with conservative measures including shoe inserts, stretches, and topical gels.  He was seen by sports medicine last year and since then has done well until recently but he is also stopped doing the stretches.  Pain is mild overall and not bothersome enough to follow-up with sports medicine again at this time.  He will start doing the stretches again and use Voltaren  gel as needed.

## 2023-07-13 NOTE — Assessment & Plan Note (Signed)
 Continues in normal sinus rhythm and follows with cardiology, last appointment 05/13/2023.  In normal sinus rhythm today. - Continue dofetilide  500 mcg twice daily, carvedilol  50 mg twice daily, diltiazem  240 mg daily, and Eliquis  5 mg twice daily

## 2023-07-13 NOTE — Progress Notes (Signed)
 CC: Routine Follow Up for hypertension after last office visit 04/27/2022  HPI:  Robert White is a 58 y.o. male with pertinent PMH of HTN, persistent A-fib on Eliquis , HFrecEF, PVCs, and obesity who presents as above. Please see assessment and plan below for further details.  Medications: Current Outpatient Medications  Medication Instructions   apixaban  (ELIQUIS ) 5 mg, 2 times daily   atorvastatin  (LIPITOR) 80 mg, Oral, Daily   carvedilol  (COREG ) 50 mg, Oral, 2 times daily   diclofenac  Sodium (VOLTAREN ) 1 % GEL APPLY 2 GRAMS TO AFFECTED AREA 4 TIMES A DAY   diltiazem  (CARDIZEM  CD) 240 mg, Oral, Daily   dofetilide  (TIKOSYN ) 500 mcg, Oral, 2 times daily   Eliquis  5 mg, Oral, 2 times daily   magnesium  oxide (MAG-OX) 400 mg, Oral, Daily   olmesartan  (BENICAR ) 40 mg, Oral, Daily   spironolactone  (ALDACTONE ) 25 mg, Oral, Daily   Wegovy  0.25 mg, Subcutaneous, Weekly   Wegovy  0.5 mg, Subcutaneous, Weekly   Wegovy  1 mg, Subcutaneous, Weekly     Review of Systems:   Pertinent items noted in HPI and/or A&P.  Physical Exam:  Vitals:   07/13/23 0840 07/13/23 0857  BP: (!) 141/94 (!) 135/92  Pulse: 76 75  Temp: 98.4 F (36.9 C)   TempSrc: Oral   SpO2: 100%   Weight: 257 lb (116.6 kg)   Height: 5' 10 (1.778 m)     Constitutional: Well-appearing adult male. In no acute distress. HEENT: Normocephalic, atraumatic, Sclera non-icteric, PERRL, EOM intact Cardio:Regular rate and rhythm. 2+ bilateral radial and dorsalis pedis  pulses. Pulm:Clear to auscultation bilaterally. Normal work of breathing on room air. Abdomen: Soft, non-tender, non-distended, positive bowel sounds. FDX:Wzhjupcz for extremity edema. Skin:Warm and dry. Neuro:Alert and oriented x3. No focal deficit noted. Psych:Pleasant mood and affect.   Assessment & Plan:   Essential hypertension Blood pressure slightly elevated today at 135/92.  Previously well-controlled and he has intermittently been taking his blood  pressure at home with readings less than 120/80.  His health and wellness provider discontinued spironolactone  sometime last year and patient reports that he was having some low libido that improved after stopping this medication.  He recently followed up with cardiology and it is unclear if they know he is off spironolactone .  BMP in April 2025 shows stable renal function and no electrolyte abnormalities. - Continue diltiazem  240 mg daily, olmesartan  40 mg daily, and carvedilol  50 mg twice daily - Message cardiology to clarify spironolactone  use - 1 week blood pressure log with goal blood pressure less than 120/80  Atrial fibrillation (HCC) Continues in normal sinus rhythm and follows with cardiology, last appointment 05/13/2023.  In normal sinus rhythm today. - Continue dofetilide  500 mcg twice daily, carvedilol  50 mg twice daily, diltiazem  240 mg daily, and Eliquis  5 mg twice daily  Heart failure with recovered ejection fraction (HFrecEF) (HCC) Continues to do well with recovered ejection fraction and is not having any signs or symptoms of heart failure exacerbation.  He is off spironolactone  and this we clarified with cardiology.  Also wonder if he would benefit from an SGLT2 inhibitor although in the past this has been deferred. - Continue olmesartan  40 mg daily, carvedilol  50 mg BID  CKD (chronic kidney disease), stage III (HCC) Renal function continues to be stable with last BMP on 05/13/2023 showing EGFR of 59.  Plan to repeat BMP in 3 months at follow-up.  Plantar fasciitis Patient with bilateral plantar fasciitis that has been well-controlled with conservative  measures including shoe inserts, stretches, and topical gels.  He was seen by sports medicine last year and since then has done well until recently but he is also stopped doing the stretches.  Pain is mild overall and not bothersome enough to follow-up with sports medicine again at this time.  He will start doing the stretches again  and use Voltaren  gel as needed.    Patient discussed with Dr. Layman Bee  Fairy Pool, DO [facility] Internal [facility] Resident PGY-2 Clinic Phone: [phone] Please contact the on call pager at [phone] for any urgent or emergent needs.

## 2023-07-13 NOTE — Progress Notes (Signed)
 [facility] Attending  Case discussed with the resident at the time of the visit.  We reviewed the resident's history and exam and pertinent patient test results.  I agree with the assessment, diagnosis, and plan of care documented in the resident's note.

## 2023-07-13 NOTE — Patient Instructions (Addendum)
 Thank you, Mr.Deloris FORBES [facility], for allowing us  to provide your care today. Today we discussed . . .  > High blood pressure       - For the next week I would like you to check your blood pressure twice a day and recorded on the blood pressure log that I have given you.  If these numbers are between 90-120 on the top and 60-80 on the bottom then we will not make any changes to medications.  If the values are mostly higher than that on the bottom or the top please call us  next week and we will talk about either trying to add back spironolactone  or another medication. > Plantar fasciitis       - Please let us  or Dr. Keller office know if the stretches and creams do not work to improve your symptoms.  We will plan to see you back in about 3 to 4 months to recheck your blood pressure and check your electrolytes and kidney function.  Follow up: 3-4 months    Remember:  Should you have any questions or concerns please call the internal medicine clinic at [phone].     Fairy Pool, DO [facility]

## 2023-07-13 NOTE — Assessment & Plan Note (Signed)
 Blood pressure slightly elevated today at 135/92.  Previously well-controlled and he has intermittently been taking his blood pressure at home with readings less than 120/80.  His health and wellness provider discontinued spironolactone  sometime last year and patient reports that he was having some low libido that improved after stopping this medication.  He recently followed up with cardiology and it is unclear if they know he is off spironolactone .  BMP in April 2025 shows stable renal function and no electrolyte abnormalities. - Continue diltiazem  240 mg daily, olmesartan  40 mg daily, and carvedilol  50 mg twice daily - Message cardiology to clarify spironolactone  use - 1 week blood pressure log with goal blood pressure less than 120/80

## 2023-07-13 NOTE — Assessment & Plan Note (Addendum)
 Continues to do well with recovered ejection fraction and is not having any signs or symptoms of heart failure exacerbation.  He is off spironolactone  and this we clarified with cardiology.  Also wonder if he would benefit from an SGLT2 inhibitor although in the past this has been deferred. - Continue olmesartan  40 mg daily, carvedilol  50 mg BID

## 2023-07-13 NOTE — Assessment & Plan Note (Signed)
 Renal function continues to be stable with last BMP on 05/13/2023 showing EGFR of 59.  Plan to repeat BMP in 3 months at follow-up.

## 2023-07-29 ENCOUNTER — Other Ambulatory Visit: Payer: Self-pay

## 2023-07-29 MED ORDER — MAGNESIUM OXIDE -MG SUPPLEMENT 400 (240 MG) MG PO TABS: 1.0000 | ORAL_TABLET | Freq: Every day | ORAL | 2 refills | Status: AC

## 2023-08-25 ENCOUNTER — Other Ambulatory Visit: Payer: Self-pay

## 2023-08-25 ENCOUNTER — Other Ambulatory Visit: Payer: Self-pay | Admitting: Student

## 2023-08-25 DIAGNOSIS — I10 Essential (primary) hypertension: Secondary | ICD-10-CM

## 2023-08-25 DIAGNOSIS — I4819 Other persistent atrial fibrillation: Secondary | ICD-10-CM

## 2023-08-25 MED ORDER — APIXABAN 5 MG PO TABS: 5.0000 mg | ORAL_TABLET | Freq: Two times a day (BID) | ORAL | 5 refills | Status: AC

## 2023-08-25 NOTE — Telephone Encounter (Signed)
 Medication sent to pharmacy

## 2023-08-25 NOTE — Telephone Encounter (Signed)
 Prescription refill request for Eliquis  received. Indication: Afib  Last office visit: 05/13/23 Ema)  Scr: 1.37 (05/13/23)  Age: 57 Weight: 116.6kg  Appropriate dose. Refill sent.

## 2023-10-18 ENCOUNTER — Other Ambulatory Visit (HOSPITAL_COMMUNITY): Payer: Self-pay

## 2023-10-18 ENCOUNTER — Telehealth: Payer: Self-pay | Admitting: Internal Medicine

## 2023-10-18 ENCOUNTER — Telehealth: Payer: Self-pay | Admitting: Pharmacy Technician

## 2023-10-18 NOTE — Telephone Encounter (Signed)
 Pt c/o medication issue:  1. Name of Medication: apixaban  (ELIQUIS ) 5 MG TABS tablet   2. How are you currently taking this medication (dosage and times per day)? As written   3. Are you having a reaction (difficulty breathing--STAT)? No   4. What is your medication issue? Pt called in stating his medication is $57. He states previously he had a copay card, he wants to know if he can have another one. Please advise.

## 2023-10-18 NOTE — Telephone Encounter (Signed)
 I called [facility] and they said that price was with the coupon and he paid 57.00 last time as well  I called the patient and made him aware there is no other option for assistance

## 2023-10-18 NOTE — Telephone Encounter (Signed)
 Spoke to [facility] and that was the price with coupon

## 2023-10-26 ENCOUNTER — Ambulatory Visit (INDEPENDENT_AMBULATORY_CARE_PROVIDER_SITE_OTHER): Admitting: Student

## 2023-10-26 VITALS — BP 125/86 | HR 76 | Temp 98.1°F | Ht 70.0 in | Wt 258.2 lb

## 2023-10-26 DIAGNOSIS — E66812 Obesity, class 2: Secondary | ICD-10-CM

## 2023-10-26 DIAGNOSIS — N1831 Chronic kidney disease, stage 3a: Secondary | ICD-10-CM

## 2023-10-26 DIAGNOSIS — I130 Hypertensive heart and chronic kidney disease with heart failure and stage 1 through stage 4 chronic kidney disease, or unspecified chronic kidney disease: Secondary | ICD-10-CM | POA: Diagnosis not present

## 2023-10-26 DIAGNOSIS — Z79899 Other long term (current) drug therapy: Secondary | ICD-10-CM

## 2023-10-26 DIAGNOSIS — I480 Paroxysmal atrial fibrillation: Secondary | ICD-10-CM

## 2023-10-26 DIAGNOSIS — Z7985 Long-term (current) use of injectable non-insulin antidiabetic drugs: Secondary | ICD-10-CM

## 2023-10-26 DIAGNOSIS — Z23 Encounter for immunization: Secondary | ICD-10-CM | POA: Diagnosis not present

## 2023-10-26 DIAGNOSIS — I5020 Unspecified systolic (congestive) heart failure: Secondary | ICD-10-CM

## 2023-10-26 DIAGNOSIS — M722 Plantar fascial fibromatosis: Secondary | ICD-10-CM

## 2023-10-26 DIAGNOSIS — I10 Essential (primary) hypertension: Secondary | ICD-10-CM

## 2023-10-26 DIAGNOSIS — Z6837 Body mass index (BMI) 37.0-37.9, adult: Secondary | ICD-10-CM

## 2023-10-26 DIAGNOSIS — Z7901 Long term (current) use of anticoagulants: Secondary | ICD-10-CM

## 2023-10-26 NOTE — Progress Notes (Signed)
 [facility] Attending  Case discussed with the resident at the time of the visit.  We reviewed the resident's history and exam and pertinent patient test results.  I agree with the assessment, diagnosis, and plan of care documented in the resident's note.

## 2023-10-26 NOTE — Assessment & Plan Note (Signed)
 Last BMP from 04/2023 shows GFR of 59.  While on ARB therapy we will continue to monitor his BMP.  Also discussed SGLT2 therapy with comorbid heart failure with recovered ejection fraction.  He would like to talk to his nephrologist about this before starting it.  Since we are working on checking the price of his DOAC we will also do a check on this medication. - BMP today

## 2023-10-26 NOTE — Assessment & Plan Note (Signed)
 Symptoms have resolved since his last visit and he is wearing near daily compression therapy which helps a lot.

## 2023-10-26 NOTE — Assessment & Plan Note (Signed)
 Blood pressure well-controlled today at 125/86.  We clarified that he is not taking spironolactone  and so I have removed this from his medication list.  He is about due for his cardiology follow-up.  Last BMP was in 04/2023.  Continues to do home blood pressure monitoring which shows systolics predominantly in the 120s and diastolics in the mid 80s with some in the 70s.  He is losing weight as well after starting a GLP-1 agonist which should also help lower his blood pressure.  He is counseled to continue checking his blood pressure and let us  know if he has any low readings or orthostatic symptoms. - Continue diltiazem  240 mg daily, olmesartan  40 mg daily, and carvedilol  50 mg twice daily - BMP today

## 2023-10-26 NOTE — Assessment & Plan Note (Signed)
 Continues to do well on current GDMT of ARB and beta-blocker.  As above he is off spironolactone .  I do think he would benefit from an SGLT2 inhibitor with his CKD stage IIIa.  He will discuss this with his nephrologist at his visit around March 2026. - Continue olmesartan  40 mg daily, carvedilol  50 mg BID

## 2023-10-26 NOTE — Assessment & Plan Note (Signed)
 BMI 37 today with weight loss of roughly 20 pounds since starting GLP-1 agonist therapy.  He has switched from semaglutide  to tirzepatide and is on the 7.5 mg dose without significant side effects.  He follows with [facility] and weight management for this.

## 2023-10-26 NOTE — Assessment & Plan Note (Signed)
 Continues to follow with cardiology with his last appointment on 05/09/2023.  Recommended to set up his 85-month follow-up in the next month.  He has had some trouble with an increase in the price of his Eliquis  so we will check on the price of Xarelto  if cardiology office has not already done this.  Also recommended that he discuss this with his cardiologist at his next visit. - Continue dofetilide  500 mcg twice daily, carvedilol  50 mg twice daily, diltiazem  240 mg daily, and Eliquis  5 mg twice daily

## 2023-10-26 NOTE — Progress Notes (Signed)
 CC: Routine Follow Up for management of chronic medical conditions after last office visit 07/13/2023  HPI:  Robert White is a 58 y.o. male with pertinent PMH of HTN, persistent A-fib on Eliquis , HFrecEF, PVCs, and obesity who presents as above. Please see assessment and plan below for further details.  Medications: Current Outpatient Medications  Medication Instructions   apixaban  (ELIQUIS ) 5 mg, Oral, 2 times daily   atorvastatin  (LIPITOR) 80 mg, Oral, Daily   carvedilol  (COREG ) 50 mg, Oral, 2 times daily   diclofenac  Sodium (VOLTAREN ) 1 % GEL APPLY 2 GRAMS TO AFFECTED AREA 4 TIMES A DAY   diltiazem  (CARDIZEM  CD) 240 mg, Oral, Daily   dofetilide  (TIKOSYN ) 500 mcg, Oral, 2 times daily   magnesium  oxide (MAG-OX) 400 mg, Oral, Daily   olmesartan  (BENICAR ) 40 mg, Oral, Daily   tirzepatide (MOUNJARO) 7.5 mg, Weekly     Review of Systems:   Pertinent items noted in HPI and/or A&P.  Physical Exam:  Vitals:   10/26/23 0807  BP: 125/86  Pulse: 76  Temp: 98.1 F (36.7 C)  TempSrc: Oral  SpO2: 99%  Weight: 258 lb 3.2 oz (117.1 kg)  Height: 5' 10 (1.778 m)    Constitutional: Well-appearing adult male. In no acute distress. HEENT: Normocephalic, atraumatic, Sclera non-icteric, PERRL, EOM intact Cardio:Regular rate and rhythm. 2+ bilateral radial pulses. Pulm:Clear to auscultation bilaterally. Normal work of breathing on room air. FDX:Wzhjupcz for extremity edema. Skin:Warm and dry. Neuro:Alert and oriented x3. No focal deficit noted. Psych:Pleasant mood and affect.   Assessment & Plan:   Assessment & Plan Essential hypertension Blood pressure well-controlled today at 125/86.  We clarified that he is not taking spironolactone  and so I have removed this from his medication list.  He is about due for his cardiology follow-up.  Last BMP was in 04/2023.  Continues to do home blood pressure monitoring which shows systolics predominantly in the 120s and diastolics in the mid 80s  with some in the 70s.  He is losing weight as well after starting a GLP-1 agonist which should also help lower his blood pressure.  He is counseled to continue checking his blood pressure and let us  know if he has any low readings or orthostatic symptoms. - Continue diltiazem  240 mg daily, olmesartan  40 mg daily, and carvedilol  50 mg twice daily - BMP today Stage 3a chronic kidney disease (HCC) Last BMP from 04/2023 shows GFR of 59.  While on ARB therapy we will continue to monitor his BMP.  Also discussed SGLT2 therapy with comorbid heart failure with recovered ejection fraction.  He would like to talk to his nephrologist about this before starting it.  Since we are working on checking the price of his DOAC we will also do a check on this medication. - BMP today Encounter for immunization Received the flu vaccine today.  Will get the COVID-vaccine at the pharmacy and we will look for his hepatitis B vaccination records.  If no records can be found we can consider testing for hepatitis B immunity. Paroxysmal atrial fibrillation (HCC) Continues to follow with cardiology with his last appointment on 05/09/2023.  Recommended to set up his 44-month follow-up in the next month.  He has had some trouble with an increase in the price of his Eliquis  so we will check on the price of Xarelto  if cardiology office has not already done this.  Also recommended that he discuss this with his cardiologist at his next visit. - Continue dofetilide  500  mcg twice daily, carvedilol  50 mg twice daily, diltiazem  240 mg daily, and Eliquis  5 mg twice daily Heart failure with recovered ejection fraction (HFrecEF) (HCC) Continues to do well on current GDMT of ARB and beta-blocker.  As above he is off spironolactone .  I do think he would benefit from an SGLT2 inhibitor with his CKD stage IIIa.  He will discuss this with his nephrologist at his visit around March 2026. - Continue olmesartan  40 mg daily, carvedilol  50 mg BID Plantar  fasciitis Symptoms have resolved since his last visit and he is wearing near daily compression therapy which helps a lot. Obesity, Class II, BMI 35-39.9 BMI 37 today with weight loss of roughly 20 pounds since starting GLP-1 agonist therapy.  He has switched from semaglutide  to tirzepatide and is on the 7.5 mg dose without significant side effects.  He follows with [facility] and weight management for this.  Orders Placed This Encounter  Procedures   Flu vaccine trivalent PF, 6mos and older(Flulaval,Afluria,Fluarix,Fluzone)   Basic metabolic panel with GFR     Patient discussed with Dr. Jone Dauphin  Fairy Pool, DO [facility] Internal Medicine Resident PGY-3 Clinic Phone: [phone] Please contact the on call pager at [phone] for any urgent or emergent needs.

## 2023-10-26 NOTE — Patient Instructions (Signed)
 Thank you, [name], for allowing us  to provide your care today. Today we discussed . . .  > Hypertension       - You are doing a very good job controlling your blood pressure.  I am not can a change in your medications today but I would like you to make sure you get a follow-up with your cardiologist in the next month since they wanted you to follow-up about 6 months after your last visit in April.  Please also continue to check your blood pressure every few days at home and let us  know if you are having any large changes in the numbers going up or down.  As you lose weight on your new medication your blood pressure should improve and the medications might need to be changed to little bit so let us  know if you have any lightheadedness or dizziness or if the top number on your blood pressure is below 90 or the bottom number is below 60. > Atrial fibrillation       - As above please schedule a follow-up with your cardiologist and I will send a message to our pharmacy team about checking on the Eliquis  and the price of Xarelto  to see if we can find a cheaper option for you.  If you find any vaccination records about hepatitis B vaccine please let us  know especially if you have gotten a vaccine in the past few years.  We can and put this into our system and if we have the documentation then we do not need to check your hepatitis B titers but we can check your titers if we are unable to get any records and see if we need to start the hepatitis B vaccine series for you.  Please also let us  know if you are unable to get your COVID-vaccine at the pharmacy and we can send a prescription in.  I have ordered the following labs for you:   Lab Orders         Basic metabolic panel with GFR      Follow up: 6 months    Remember:  Should you have any questions or concerns please call the internal medicine clinic at [phone].     Fairy Pool, DO [facility]

## 2023-10-27 LAB — BASIC METABOLIC PANEL WITH GFR
BUN/Creatinine Ratio: 18 (ref 9–20)
BUN: 23 mg/dL (ref 6–24)
CO2: 21 mmol/L (ref 20–29)
Calcium: 9.2 mg/dL (ref 8.7–10.2)
Chloride: 102 mmol/L (ref 96–106)
Creatinine, Ser: 1.3 mg/dL — ABNORMAL HIGH (ref 0.76–1.27)
Glucose: 80 mg/dL (ref 70–99)
Potassium: 4.6 mmol/L (ref 3.5–5.2)
Sodium: 138 mmol/L (ref 134–144)
eGFR: 63 mL/min/1.73 (ref >59)

## 2023-10-28 ENCOUNTER — Other Ambulatory Visit (HOSPITAL_COMMUNITY): Payer: Self-pay
